# Patient Record
Sex: Female | Born: 1990 | Race: White | Hispanic: No | Marital: Single | Smoking: Former smoker
Health system: Southern US, Community
[De-identification: ages and names within clinical notes are randomized; demographics above are authoritative.]

## PROBLEM LIST (undated history)

## (undated) ENCOUNTER — Emergency Department (HOSPITAL_COMMUNITY): Admission: EM | Payer: Self-pay

## (undated) VITALS — BP 98/69 | HR 73 | Temp 98.3°F | Resp 19 | Ht 62.5 in | Wt 123.0 lb

## (undated) DIAGNOSIS — F32A Depression, unspecified: Secondary | ICD-10-CM

## (undated) DIAGNOSIS — F319 Bipolar disorder, unspecified: Secondary | ICD-10-CM

## (undated) DIAGNOSIS — F329 Major depressive disorder, single episode, unspecified: Secondary | ICD-10-CM

## (undated) DIAGNOSIS — F419 Anxiety disorder, unspecified: Secondary | ICD-10-CM

## (undated) DIAGNOSIS — T7840XA Allergy, unspecified, initial encounter: Secondary | ICD-10-CM

## (undated) HISTORY — DX: Anxiety disorder, unspecified: F41.9

## (undated) HISTORY — PX: TONSILLECTOMY: SUR1361

## (undated) HISTORY — DX: Bipolar disorder, unspecified: F31.9

## (undated) HISTORY — PX: OTHER SURGICAL HISTORY: SHX169

## (undated) HISTORY — DX: Allergy, unspecified, initial encounter: T78.40XA

---
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
## 2008-02-22 ENCOUNTER — Other Ambulatory Visit: Admission: RE | Admit: 2008-02-22 | Discharge: 2008-02-22 | Payer: Self-pay | Admitting: Obstetrics and Gynecology

## 2008-02-22 ENCOUNTER — Ambulatory Visit: Payer: Self-pay | Admitting: Obstetrics and Gynecology

## 2008-10-31 ENCOUNTER — Ambulatory Visit: Payer: Self-pay | Admitting: Obstetrics and Gynecology

## 2009-01-29 ENCOUNTER — Other Ambulatory Visit: Admission: RE | Admit: 2009-01-29 | Discharge: 2009-01-29 | Payer: Self-pay | Admitting: Obstetrics and Gynecology

## 2009-01-29 ENCOUNTER — Encounter: Payer: Self-pay | Admitting: Obstetrics and Gynecology

## 2009-01-29 ENCOUNTER — Ambulatory Visit: Payer: Self-pay | Admitting: Obstetrics and Gynecology

## 2009-07-04 ENCOUNTER — Ambulatory Visit: Payer: Self-pay | Admitting: Obstetrics and Gynecology

## 2009-07-11 ENCOUNTER — Ambulatory Visit: Payer: Self-pay | Admitting: Obstetrics and Gynecology

## 2010-01-18 ENCOUNTER — Emergency Department (HOSPITAL_COMMUNITY): Admission: EM | Admit: 2010-01-18 | Discharge: 2010-01-18 | Payer: Self-pay | Admitting: Emergency Medicine

## 2010-02-26 ENCOUNTER — Ambulatory Visit: Payer: Self-pay | Admitting: Obstetrics and Gynecology

## 2010-03-04 ENCOUNTER — Other Ambulatory Visit: Admission: RE | Admit: 2010-03-04 | Discharge: 2010-03-04 | Payer: Self-pay | Admitting: Obstetrics and Gynecology

## 2010-03-04 ENCOUNTER — Ambulatory Visit: Payer: Self-pay | Admitting: Obstetrics and Gynecology

## 2010-04-04 ENCOUNTER — Encounter (INDEPENDENT_AMBULATORY_CARE_PROVIDER_SITE_OTHER): Payer: Self-pay | Admitting: *Deleted

## 2010-07-17 NOTE — Letter (Signed)
Summary: New Patient letter  [facility]  [address] [place], [place] [address]   Phone: [phone]  Fax: [phone]       04/04/2010 MRN: [id number]  Dawn Porter [address] [place] [address]  Dear [name],  Welcome to the Gastroenterology Division at [facility].    You are scheduled to see Dr. Christella Hartigan on 05/14/2010 at 9:00AM on the 3rd floor at [facility], [address]  We ask that you try to arrive at our office 15 minutes prior to your appointment time to allow for check-in.  We would like you to complete the enclosed self-administered evaluation form prior to your visit and bring it with you on the day of your appointment.  We will review it with you.  Also, please bring a complete list of all your medications or, if you prefer, bring the medication bottles and we will list them.  Please bring your insurance card so that we may make a copy of it.  If your insurance requires a referral to see a specialist, please bring your referral form from your primary care physician.  Co-payments are due at the time of your visit and may be paid by cash, check or credit card.     Your office visit will consist of a consult with your physician (includes a physical exam), any laboratory testing he/she may order, scheduling of any necessary diagnostic testing (e.g. x-ray, ultrasound, CT-scan), and scheduling of a procedure (e.g. Endoscopy, Colonoscopy) if required.  Please allow enough time on your schedule to allow for any/all of these possibilities.    If you cannot keep your appointment, please call [phone] to cancel or reschedule prior to your appointment date.  This allows [place] the opportunity to schedule an appointment for another patient in need of care.  If you do not cancel or reschedule by 5 p.m. the business day prior to your appointment date, you will be charged a $50.00 late cancellation/no-show fee.    Thank you for choosing  [facility] for your medical needs.  We appreciate the opportunity to care for you.  Please visit [place] at our website  to learn more about our practice.                     Sincerely,                                                             The Gastroenterology Division

## 2010-08-13 ENCOUNTER — Other Ambulatory Visit: Payer: Self-pay | Admitting: Emergency Medicine

## 2010-08-13 ENCOUNTER — Ambulatory Visit
Admission: RE | Admit: 2010-08-13 | Discharge: 2010-08-13 | Disposition: A | Discharge status: Home / Self Care | Payer: PRIVATE HEALTH INSURANCE | Source: Ambulatory Visit | Attending: Emergency Medicine | Admitting: Emergency Medicine

## 2010-08-13 DIAGNOSIS — R110 Nausea: Secondary | ICD-10-CM

## 2010-08-13 DIAGNOSIS — S0990XA Unspecified injury of head, initial encounter: Secondary | ICD-10-CM

## 2010-10-22 ENCOUNTER — Ambulatory Visit: Payer: PRIVATE HEALTH INSURANCE | Admitting: Gastroenterology

## 2011-02-11 ENCOUNTER — Other Ambulatory Visit: Payer: Self-pay

## 2011-02-11 NOTE — Telephone Encounter (Signed)
Please give the patient Diflucan 150 mg daily for 3 days.

## 2011-02-11 NOTE — Telephone Encounter (Signed)
PT. HAS AN AEX WITH YOU 03-11-11. BUT CURRENTLY HAVING VAGINAL ITCHING AND WHITE DISCHARGE. REQUESTING REFILL DIFLUCAN. THINKS SHE IS ALLEIGIC  TO OTC YEAST RX'S.

## 2011-02-12 MED ORDER — FLUCONAZOLE 150 MG PO TABS: 150.0000 mg | ORAL_TABLET | Freq: Every day | ORAL | Status: AC

## 2011-02-12 NOTE — Telephone Encounter (Signed)
PT. NOTIFIED BY CELL # VOICEMAIL RX SENT TO [place].

## 2011-03-11 ENCOUNTER — Ambulatory Visit (INDEPENDENT_AMBULATORY_CARE_PROVIDER_SITE_OTHER): Payer: PRIVATE HEALTH INSURANCE | Admitting: Obstetrics and Gynecology

## 2011-03-11 ENCOUNTER — Other Ambulatory Visit (HOSPITAL_COMMUNITY)
Admission: RE | Admit: 2011-03-11 | Discharge: 2011-03-11 | Disposition: A | Discharge status: Home / Self Care | Payer: PRIVATE HEALTH INSURANCE | Source: Ambulatory Visit | Attending: Obstetrics and Gynecology | Admitting: Obstetrics and Gynecology

## 2011-03-11 ENCOUNTER — Encounter: Payer: Self-pay | Admitting: Obstetrics and Gynecology

## 2011-03-11 DIAGNOSIS — Z113 Encounter for screening for infections with a predominantly sexual mode of transmission: Secondary | ICD-10-CM

## 2011-03-11 DIAGNOSIS — R82998 Other abnormal findings in urine: Secondary | ICD-10-CM

## 2011-03-11 DIAGNOSIS — F319 Bipolar disorder, unspecified: Secondary | ICD-10-CM | POA: Insufficient documentation

## 2011-03-11 DIAGNOSIS — Z01419 Encounter for gynecological examination (general) (routine) without abnormal findings: Secondary | ICD-10-CM

## 2011-03-11 DIAGNOSIS — J45909 Unspecified asthma, uncomplicated: Secondary | ICD-10-CM | POA: Insufficient documentation

## 2011-03-11 NOTE — Progress Notes (Signed)
Patient came to see me today for her annual GYN exam. Her periods are regular. She has no current needs for contraception. She is having no GYN problems.  Physical examination: HEENT within normal limits. Neck: Thyroid not large. No masses. Supraclavicular nodes: not enlarged. Breasts: Examined in both sitting midline position. No skin changes and no masses. Abdomen: Soft no guarding rebound or masses or hernia. Pelvic: External: Within normal limits. BUS: Within normal limits. Vaginal:within normal limits. Good estrogen effect. No evidence of cystocele rectocele or enterocele. Cervix: clean. Uterus: Normal size and shape. Adnexa: No masses. Rectovaginal exam: Confirmatory and negative. Extremities: Within normal limits.  Assessment: Normal GYN exam  Plan: RTO one year recall

## 2011-08-28 ENCOUNTER — Ambulatory Visit (INDEPENDENT_AMBULATORY_CARE_PROVIDER_SITE_OTHER): Payer: PRIVATE HEALTH INSURANCE | Admitting: Obstetrics and Gynecology

## 2011-08-28 ENCOUNTER — Other Ambulatory Visit: Payer: Self-pay | Admitting: Obstetrics and Gynecology

## 2011-08-28 DIAGNOSIS — N899 Noninflammatory disorder of vagina, unspecified: Secondary | ICD-10-CM

## 2011-08-28 DIAGNOSIS — M549 Dorsalgia, unspecified: Secondary | ICD-10-CM

## 2011-08-28 DIAGNOSIS — N898 Other specified noninflammatory disorders of vagina: Secondary | ICD-10-CM

## 2011-08-28 LAB — URINALYSIS W MICROSCOPIC + REFLEX CULTURE
Bilirubin Urine: NEGATIVE
Glucose, UA: NEGATIVE mg/dL
Ketones, ur: NEGATIVE mg/dL
Leukocytes, UA: NEGATIVE
Specific Gravity, Urine: 1.015 (ref 1.005–1.030)
Urobilinogen, UA: 0.2 mg/dL (ref 0.0–1.0)

## 2011-08-28 LAB — WET PREP FOR TRICH, YEAST, CLUE
Clue Cells Wet Prep HPF POC: NONE SEEN
Trich, Wet Prep: NONE SEEN
Yeast Wet Prep HPF POC: NONE SEEN

## 2011-08-28 MED ORDER — TERCONAZOLE 0.8 % VA CREA: 1.0000 | TOPICAL_CREAM | Freq: Every day | VAGINAL | Status: AC

## 2011-08-28 NOTE — Progress Notes (Signed)
Patient came to see me today with a 4 day history of vaginal discharge, itching, and seeing some light spotting with the  Discharge. She called and the on call physician gave her 2 Diflucan which have not helped. Her last menstrual period was 3 weeks ago. She has noticed some left lower quadrant discomfort and back discomfort. She is having no dysuria, urgency, or frequency.  Exam: Kennon Portela present. Abdomen is soft without guarding rebound or masses. Pelvic exam: External: Within normal limits. BUS within normal limits. Vaginal exam: Within normal limits except for some white discharge. Cervix: Clean. Light spotting from os.  Uterus: Within normal limits. Adnexa: Within normal limits. Rectovaginal exam: Within normal limits. There is no tenderness on exam. Wet prep was negative except for red blood cells. Urinalysis also showed red blood cells and bacteria. Culture will be done.  Assessment: Probable yeast vaginitis not sense to Diflucan  Plan: terconazole 3 cream. Urine culture. Patient is started a new relationship and was interested in birth control. She would rather not use NuvaRing or pills. We discussed a Mirena IUD. Patient very interested. Booklet given. We will get her approved. She will call if pain persists.

## 2011-08-30 LAB — URINE CULTURE: Organism ID, Bacteria: NO GROWTH

## 2011-08-31 ENCOUNTER — Telehealth: Payer: Self-pay | Admitting: *Deleted

## 2011-08-31 ENCOUNTER — Other Ambulatory Visit: Payer: Self-pay | Admitting: *Deleted

## 2011-08-31 DIAGNOSIS — Z3049 Encounter for surveillance of other contraceptives: Secondary | ICD-10-CM

## 2011-08-31 MED ORDER — LEVONORGESTREL 20 MCG/24HR IU IUD: INTRAUTERINE_SYSTEM | Freq: Once | INTRAUTERINE | Status: DC

## 2011-08-31 NOTE — Telephone Encounter (Signed)
Message copied by Libby Maw on Mon Aug 31, 2011  3:40 PM ------      Message from: Trellis Paganini      Created: Fri Aug 28, 2011  2:40 PM       Please get patient approved for Mirena IUD.

## 2011-08-31 NOTE — Telephone Encounter (Signed)
Patient informed Mirena IUD covered at 100%.  Will call back to schedule insert with DG when on period.

## 2011-09-23 ENCOUNTER — Ambulatory Visit (INDEPENDENT_AMBULATORY_CARE_PROVIDER_SITE_OTHER): Payer: PRIVATE HEALTH INSURANCE | Admitting: Obstetrics and Gynecology

## 2011-09-23 DIAGNOSIS — Z3049 Encounter for surveillance of other contraceptives: Secondary | ICD-10-CM

## 2011-09-23 DIAGNOSIS — Z30430 Encounter for insertion of intrauterine contraceptive device: Secondary | ICD-10-CM

## 2011-09-23 NOTE — Progress Notes (Signed)
Patient came to see me today for Mirena IUD insertion due to need for contraception and dysmenorrhea. She previously tried  the NuvaRing but could not keep it in. She was not a good pill taker in terms of her remembering. She filled up the booklet and all her questions were answered.  Exam:Pelvic exam: External within normal limits. BUS within normal limits. Vaginal exam within normal limits. Cervix is clean without lesions. Uterus is normal size and shape. Adnexa failed to reveal masses. Rectovaginal examination is confirmatory and without masses. Kennon Portela present for exam and IUD insertion.  Assessment: Need for contraception and dysmenorrhea.  Plan: Patient was given a paracervical block with 20 cc of 1% plain Xylocaine. Patient was very nervous. Her uterus could not be sounded due to to cervical size. She was dilated. Her cervix then sounded to 6 cm. She has some discomfort with the procedure. A Mirena IUD was then inserted. Initially it was in place. However after several minutes she expelled it. We attempted insertion a second time and the same thing happened. We discussed other options including Depo-Provera or nexplanon. 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 We elected to try birth control pills and she will make an effort to find away can remember them. Samples of lo- Loestrin were given with explicit directions including condom backup for at least one cycle. Patient will inform.

## 2011-09-24 ENCOUNTER — Telehealth (HOSPITAL_COMMUNITY): Payer: Self-pay | Admitting: *Deleted

## 2011-09-24 ENCOUNTER — Encounter (HOSPITAL_COMMUNITY): Payer: Self-pay | Admitting: *Deleted

## 2011-09-24 ENCOUNTER — Inpatient Hospital Stay (HOSPITAL_COMMUNITY)
Admission: RE | Admit: 2011-09-24 | Discharge: 2011-09-25 | DRG: 882 | Disposition: A | Discharge status: Home / Self Care | Payer: PRIVATE HEALTH INSURANCE | Attending: Psychiatry | Admitting: Psychiatry

## 2011-09-24 DIAGNOSIS — F4310 Post-traumatic stress disorder, unspecified: Principal | ICD-10-CM | POA: Diagnosis present

## 2011-09-24 DIAGNOSIS — R45851 Suicidal ideations: Secondary | ICD-10-CM

## 2011-09-24 DIAGNOSIS — F319 Bipolar disorder, unspecified: Secondary | ICD-10-CM

## 2011-09-24 DIAGNOSIS — J45909 Unspecified asthma, uncomplicated: Secondary | ICD-10-CM | POA: Diagnosis present

## 2011-09-24 HISTORY — DX: Depression, unspecified: F32.A

## 2011-09-24 HISTORY — DX: Major depressive disorder, single episode, unspecified: F32.9

## 2011-09-24 MED ORDER — MAGNESIUM HYDROXIDE 400 MG/5ML PO SUSP: 30.0000 mL | Freq: Every day | ORAL | Status: DC | PRN

## 2011-09-24 MED ORDER — TRAZODONE HCL 50 MG PO TABS
50.0000 mg | ORAL_TABLET | Freq: Every evening | ORAL | Status: DC | PRN
  Administered 2011-09-24: 50 mg via ORAL
  Filled 2011-09-24: qty 1

## 2011-09-24 MED ORDER — LAMOTRIGINE 25 MG PO TABS
25.0000 mg | ORAL_TABLET | Freq: Two times a day (BID) | ORAL | Status: DC
  Administered 2011-09-24: 25 mg via ORAL
  Filled 2011-09-24 (×7): qty 1

## 2011-09-24 MED ORDER — ACETAMINOPHEN 325 MG PO TABS: 650.0000 mg | ORAL_TABLET | Freq: Four times a day (QID) | ORAL | Status: DC | PRN

## 2011-09-24 MED ORDER — ALUM & MAG HYDROXIDE-SIMETH 200-200-20 MG/5ML PO SUSP
30.0000 mL | ORAL | Status: DC | PRN
Start: 2011-09-24 — End: 2011-09-25

## 2011-09-24 MED ORDER — LORATADINE 10 MG PO TABS
10.0000 mg | ORAL_TABLET | Freq: Every day | ORAL | Status: DC
  Administered 2011-09-24 – 2011-09-25 (×2): 10 mg via ORAL
  Filled 2011-09-24 (×5): qty 1

## 2011-09-24 NOTE — Progress Notes (Signed)
Patient ID: Dawn Porter, female   DOB: 07/03/1990, 21 y.o.   MRN: [id number] Pt. Reports stressors with school, reports depression at "9" out of 10, denies SHI. Pt. 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 Clinical research associate reviewed meds, pt. Notes that she take Lamictal 50 mg at home. Writer informed pt. That meds will she will be seeing the physician in am for adjustments to meds. Staff will monitor q15 min for safety. Pt. Is safe on the unit.

## 2011-09-24 NOTE — Progress Notes (Addendum)
Patient ID: Dawn Porter, female   DOB: 1990-07-24, 21 y.o.   MRN: [id number] Pt is voluntary. Pt is here because since yesterday when she went to get a IUD in and it did not work it caused her a lot of pain stressed her out. Pt woke up in the morning just feeling like she wanted to die. Pt has been depressed for her whole life and feels it also due to her being bipolar. Pt has also had a recent break up and had someone close to her die in the past year. Pt rates depression a an 8, anxiety at a 7, rates hopelessness/helplessness at a 6. Pt also suffers from OCD. Pt is currently a Consulting civil engineer at [employer]. Pt still is having suicidal ideations but does contract for safety. Pt is cooperative and polite.

## 2011-09-24 NOTE — Tx Team (Signed)
Initial Interdisciplinary Treatment Plan  PATIENT STRENGTHS: (choose at least two) Ability for insight Communication skills General fund of knowledge Motivation for treatment/growth Supportive family/friends  PATIENT STRESSORS: School and realtionship    PROBLEM LIST: Problem List/Patient Goals Date to be addressed Date deferred Reason deferred Estimated date of resolution  Deppression 09/24/11     Anxiety 09/24/11                                                DISCHARGE CRITERIA:  Adequate post-discharge living arrangements Improved stabilization in mood, thinking, and/or behavior Motivation to continue treatment in a less acute level of care  PRELIMINARY DISCHARGE PLAN: Attend aftercare/continuing care group Outpatient therapy Return to previous living arrangement  PATIENT/FAMIILY INVOLVEMENT: This treatment plan has been presented to and reviewed with the patient, [name], and/or family member.  The patient and family have been given the opportunity to ask questions and make suggestions.  [name] 09/24/2011, 7:23 PM
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
## 2011-09-25 DIAGNOSIS — R45851 Suicidal ideations: Secondary | ICD-10-CM

## 2011-09-25 DIAGNOSIS — F4310 Post-traumatic stress disorder, unspecified: Secondary | ICD-10-CM | POA: Diagnosis present

## 2011-09-25 MED ORDER — LORAZEPAM 0.5 MG PO TABS: 0.5000 mg | ORAL_TABLET | Freq: Every day | ORAL | Status: DC | PRN

## 2011-09-25 MED ORDER — MOMETASONE FUROATE 50 MCG/ACT NA SUSP: 2.0000 | Freq: Every day | NASAL | Status: DC

## 2011-09-25 MED ORDER — CETIRIZINE HCL 10 MG PO CHEW: 10.0000 mg | CHEWABLE_TABLET | Freq: Every day | ORAL | Status: DC

## 2011-09-25 MED ORDER — TEARS RENEWED OP SOLN: 1.0000 [drp] | Freq: Every day | OPHTHALMIC | Status: DC | PRN

## 2011-09-25 MED ORDER — MULTI-VITAMIN/MINERALS PO TABS: 1.0000 | ORAL_TABLET | Freq: Every day | ORAL | Status: DC

## 2011-09-25 MED ORDER — PRAZOSIN HCL 1 MG PO CAPS: 1.0000 mg | ORAL_CAPSULE | Freq: Every evening | ORAL | Status: DC | PRN

## 2011-09-25 MED ORDER — DEXMETHYLPHENIDATE HCL 5 MG PO TABS: 5.0000 mg | ORAL_TABLET | Freq: Every day | ORAL | Status: DC | PRN

## 2011-09-25 MED ORDER — LAMOTRIGINE 25 MG PO TABS: 50.0000 mg | ORAL_TABLET | Freq: Every day | ORAL | Status: DC

## 2011-09-25 MED ORDER — ONE-DAILY MULTI VITAMINS PO TABS: 1.0000 | ORAL_TABLET | Freq: Every day | ORAL | Status: AC

## 2011-09-25 MED ORDER — BIOTIN 5000 MCG PO TABS: 1.0000 | ORAL_TABLET | Freq: Every day | ORAL | Status: DC

## 2011-09-25 MED ORDER — LAMOTRIGINE 25 MG PO TABS: 25.0000 mg | ORAL_TABLET | Freq: Two times a day (BID) | ORAL | Status: DC

## 2011-09-25 NOTE — Progress Notes (Signed)
[facility] Adult Inpatient Family/Significant Other Suicide Prevention Education  Suicide Prevention Education:  Education Completed; Dawn Porter, mother, [phone]) has been identified by the patient as the family member/significant other with whom the patient will be residing, and identified as the person(s) who will aid the patient in the event of a mental health crisis (suicidal ideations/suicide attempt).  With written consent from the patient, the family member/significant other has been provided the following suicide prevention education, prior to the and/or following the discharge of the patient.  The suicide prevention education provided includes the following:  Suicide risk factors  Suicide prevention and interventions  National Suicide Hotline telephone number  [facility] assessment telephone number  [facility] Emergency Assistance 911  [facility] and/or Residential Mobile Crisis Unit telephone number  Request made of family/significant other to:  Remove weapons (e.g., guns, rifles, knives), all items previously/currently identified as safety concern.    Remove drugs/medications (over-the-counter, prescriptions, illicit drugs), all items previously/currently identified as a safety concern.  Dawn Porter reports that she has no concerns about Dawn Porter being a danger to herself or others. She verbalized understanding of suicide prevention information and verified that Dawn Porter has no access to firearms. She had no further questions.   Dawn Porter 09/25/2011, 2:15 PM

## 2011-09-25 NOTE — Progress Notes (Signed)
09/25/2011         Time: 1415      Group Topic/Focus: The focus of this group is on discussing various styles of communication and communicating assertively using 'I' (feeling) statements.  Participation Level: Active  Participation Quality: Appropriate and Attentive  Affect: Blunted  Cognitive: Oriented   Additional Comments: None.   Dawn Porter 09/25/2011 3:52 PM

## 2011-09-25 NOTE — Progress Notes (Signed)
Patient ID: Dawn Porter, female   DOB: May 16, 1991, 21 y.o.   MRN: [id number] Pt denies HI/AVH, passive SI on & off--contracts for safety.  Dawn Porter refused her antidepressant this morning, wants the total dose this evening--will discuss with MD.  Sh reported on her self inventory that she slept well, appetite good, energy level normal, ability to pay attention good, 4/10 depression, 2/10 hopelessness.
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
## 2011-09-25 NOTE — Tx Team (Addendum)
Interdisciplinary Treatment Plan Update (Adult)  Date:  09/25/2011  Time Reviewed:  10:08 AM   Progress in Treatment: Attending groups: Yes Participating in groups:  Yes Taking medication as prescribed:  Yes Tolerating medication: Yes Family/Significant othe contact made:  Requesting consent to contact parents Patient understands diagnosis: Yes Discussing patient identified problems/goals with staff:  Yes Medical problems stabilized or resolved: Yes Denies suicidal/homicidal ideation: Yes Issues/concerns per patient self-inventory:  No  Other:  New problem(s) identified: None  Reason for Continuation of Hospitalization: Medication stabilization  Interventions implemented related to continuation of hospitalization:  Medication stabilization, safety checks q 15 mins, group attendance  Additional comments:  Estimated length of stay:  3-5 days  Discharge Plan:  Discharge home with parents, follow up with  Neuropsychiatry and CM will set appointment with therapist in [facility] goal(s):  Review of initial/current patient goals per problem list:   1.  Goal(s): Decrease depressive symptoms to rating of 4 or less  Met:  Yes  Target date: by discharge  As evidenced by:  Dawn Porter rates depression at 3 today  2.  Goal (s): Decrease anxiety symptoms to rating of 4 or less   Met:  Yes  Target date: by discharge  As evidenced by: Dawn Porter rates anxiety at 3 today  3.  Goal(s): Reduce potential for self-harm  Met:  Yes  Target date: by discharge  As evidenced by: Dawn Porter denies any thoughts of suicide today  4.  Goal(s): Medication stabilization  Met:  No  Target date: by discharge  As evidenced by: Dawn Porter will report medications are working  to reduce/relieve symptoms without intolerable side effects  Attendees: Patient:  Dawn Porter 09/25/2011 10:08 AM  Family:     Physician:  Dr Orson Aloe, MD 09/25/2011 10:08 AM  Nursing:   Clearwater Cellar, RN  09/25/2011 10:08 AM  Case Manager:  Juline Patch, LCSW 09/25/2011 10:08 AM  Counselor:  Angus Palms, LCSW 09/25/2011 10:08 AM  Other:  Reyes Ivan, LCSWA 09/25/2011 10:08 AM  Other:     Other:     Other:      Scribe for Treatment Team:   [name], 09/25/2011 10:08 AM

## 2011-09-25 NOTE — Progress Notes (Signed)
Patient ID: Dawn Porter, female   DOB: 06-10-1991, 21 y.o.   MRN: [id number] Pt. met with treatment team and was allowed to be discharged today, without having completed admission labs, etc.; Pt. was given discharge instructions with handouts, prescriptions and belongings were returned.  Pt. stated she understands her discharge instructions and releases of information were signed and Pt. was discharged ambulatory to her awaiting ride home.

## 2011-09-25 NOTE — H&P (Signed)
Medical/psychiatric screening examination/treatment/procedure(s) were performed by non-physician practitioner and as supervising physician I was immediately available for consultation/collaboration.   I have seen and examined this patient and agree with this evaluation.  

## 2011-09-25 NOTE — Progress Notes (Signed)
[facility] Adult Inpatient Family/Significant Other Suicide Prevention Education  Suicide Prevention Education:  Contact Attempts: Luster Landsberg and Cheyane Ayon, parents [phone]) have been identified by the patient as the family member/significant other with whom the patient will be residing, and identified as the person(s) who will aid the patient in the event of a mental health crisis.  With written consent from the patient, two attempts were made to provide suicide prevention education, prior to and/or following the patient's discharge.  We were unsuccessful in providing suicide prevention education.  A suicide education pamphlet was given to the patient to share with family/significant other.  Date and time of first attempt: 09/25/11  @  1:58pm Date and time of second attempt:  [name] 09/25/2011, 1:59 PM

## 2011-09-25 NOTE — Progress Notes (Signed)
[facility] Group Notes:  (Counselor/Nursing/MHT/Case Management/Adjunct) 09/25/2011 11:00am Preventing Relapse   Type of Therapy:  Group Therapy  Participation Level:  Did Not Attend     [name] 09/25/2011  2:57 PM
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
## 2011-09-25 NOTE — [facility] Suicide Risk Assessment (Signed)
Suicide Risk Assessment  Admission Assessment     See Discharge Risk Assessment  Dawn Porter 09/25/2011, 4:05 PM

## 2011-09-28 NOTE — Discharge Summary (Signed)
I agree with this D/C Summary.  

## 2011-09-29 ENCOUNTER — Telehealth: Payer: Self-pay | Admitting: *Deleted

## 2011-09-29 MED ORDER — FLUCONAZOLE 150 MG PO TABS: ORAL_TABLET | ORAL | Status: DC

## 2011-09-29 NOTE — Telephone Encounter (Signed)
rx sent to pharmacy

## 2011-09-29 NOTE — Progress Notes (Signed)
Patient Discharge Instructions:  Psychiatric Admission Assessment Note Provided,  09/29/2011 After Visit Summary (AVS) Provided,  09/29/2011 Face Sheet Provided, 09/29/2011 Faxed/Sent to the Next Level Care provider:  09/29/2011 Provided Suicide Risk Assessment - Discharge Assessment 09/29/2011  Faxed to [facility] [place] @ [phone]  Wandra Scot, 09/29/2011, 5:53 PM

## 2011-09-29 NOTE — Telephone Encounter (Signed)
Pt calling c/o yeast infection x 2 days itching, white discharge only. Pt would like rx sent to pharmacy to help relieve 09/23/11.

## 2011-09-29 NOTE — Telephone Encounter (Signed)
Diflucan 150mg daily for 3 days.

## 2011-09-29 NOTE — Telephone Encounter (Signed)
Addended by: Aura Camps on: 09/29/2011 11:34 AM   Modules accepted: Orders

## 2011-11-11 ENCOUNTER — Ambulatory Visit (INDEPENDENT_AMBULATORY_CARE_PROVIDER_SITE_OTHER): Payer: PRIVATE HEALTH INSURANCE | Admitting: Obstetrics and Gynecology

## 2011-11-11 DIAGNOSIS — N939 Abnormal uterine and vaginal bleeding, unspecified: Secondary | ICD-10-CM

## 2011-11-11 DIAGNOSIS — N949 Unspecified condition associated with female genital organs and menstrual cycle: Secondary | ICD-10-CM

## 2011-11-11 DIAGNOSIS — Z113 Encounter for screening for infections with a predominantly sexual mode of transmission: Secondary | ICD-10-CM

## 2011-11-11 DIAGNOSIS — N938 Other specified abnormal uterine and vaginal bleeding: Secondary | ICD-10-CM

## 2011-11-11 DIAGNOSIS — R102 Pelvic and perineal pain: Secondary | ICD-10-CM

## 2011-11-11 DIAGNOSIS — N926 Irregular menstruation, unspecified: Secondary | ICD-10-CM

## 2011-11-11 MED ORDER — MEGESTROL ACETATE 40 MG PO TABS: 40.0000 mg | ORAL_TABLET | Freq: Two times a day (BID) | ORAL | Status: AC

## 2011-11-11 NOTE — Progress Notes (Signed)
Patient came to see me today for  a problem visit. She is on her second month of birth control pills. The first month she did well with no pain or bleeding. She had very light withdrawal. The second month she did well until 4 days ago when she had sharp pain in her lower abdomen and heavy breakthrough bleeding. She is on the third week of her pills. She tried to have intercourse 2 weeks ago and stopped because it hurt. She is having no nausea , vomiting or fever. She is on Lamictal and another drug for bipolar disease and wanted to be sure this would not make her pill less than effective.  Exam: Kennon Portela present. Abdomen is soft without guarding rebound or masses.Pelvic exam: External: Within normal limits. BUS within normal limits. Vaginal exam: Within normal limits. Cervix: Clean. Uterus: Within normal limits but tender. Adnexa: Within normal limits. Rectovaginal exam: Within normal limits.   Assessment: DUB with pelvic pain  Plan: Patient to Megace 40 mg twice a day to birth control pills until she finishes the cycle. She will do her birth control pills alone next cycle. Long acting naproxen given to patient. She will check with pharmacist to be sure that her bipolar drugs do not interfere with effectiveness of her birth control pills. GC and chlamydial cultures done. We will ultrasound her if  pain persists.

## 2011-12-16 ENCOUNTER — Ambulatory Visit (INDEPENDENT_AMBULATORY_CARE_PROVIDER_SITE_OTHER): Payer: PRIVATE HEALTH INSURANCE | Admitting: Obstetrics and Gynecology

## 2011-12-16 DIAGNOSIS — N949 Unspecified condition associated with female genital organs and menstrual cycle: Secondary | ICD-10-CM

## 2011-12-16 DIAGNOSIS — R300 Dysuria: Secondary | ICD-10-CM

## 2011-12-16 DIAGNOSIS — N938 Other specified abnormal uterine and vaginal bleeding: Secondary | ICD-10-CM

## 2011-12-16 LAB — URINALYSIS W MICROSCOPIC + REFLEX CULTURE
Bilirubin Urine: NEGATIVE
Glucose, UA: NEGATIVE mg/dL
Ketones, ur: NEGATIVE mg/dL
Protein, ur: NEGATIVE mg/dL

## 2011-12-16 MED ORDER — NITROFURANTOIN MACROCRYSTAL 50 MG PO CAPS: ORAL_CAPSULE | ORAL | Status: DC

## 2011-12-16 MED ORDER — NITROFURANTOIN MONOHYD MACRO 100 MG PO CAPS: 100.0000 mg | ORAL_CAPSULE | Freq: Two times a day (BID) | ORAL | Status: AC
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
## 2011-12-18 LAB — URINE CULTURE: Colony Count: 4000

## 2011-12-23 ENCOUNTER — Ambulatory Visit: Payer: Self-pay | Admitting: Obstetrics and Gynecology

## 2011-12-23 ENCOUNTER — Other Ambulatory Visit: Payer: Self-pay

## 2011-12-24 ENCOUNTER — Ambulatory Visit (INDEPENDENT_AMBULATORY_CARE_PROVIDER_SITE_OTHER): Payer: PRIVATE HEALTH INSURANCE | Admitting: Obstetrics and Gynecology

## 2011-12-24 ENCOUNTER — Other Ambulatory Visit: Payer: Self-pay | Admitting: Obstetrics and Gynecology

## 2011-12-24 DIAGNOSIS — R300 Dysuria: Secondary | ICD-10-CM

## 2011-12-24 DIAGNOSIS — N898 Other specified noninflammatory disorders of vagina: Secondary | ICD-10-CM

## 2011-12-24 DIAGNOSIS — L293 Anogenital pruritus, unspecified: Secondary | ICD-10-CM

## 2011-12-24 LAB — URINALYSIS W MICROSCOPIC + REFLEX CULTURE
Casts: NONE SEEN
Ketones, ur: NEGATIVE mg/dL
Nitrite: NEGATIVE
Urobilinogen, UA: 0.2 mg/dL (ref 0.0–1.0)

## 2011-12-24 LAB — WET PREP FOR TRICH, YEAST, CLUE
Clue Cells Wet Prep HPF POC: NONE SEEN
Yeast Wet Prep HPF POC: NONE SEEN

## 2011-12-24 MED ORDER — TERCONAZOLE 0.8 % VA CREA: 1.0000 | TOPICAL_CREAM | Freq: Every day | VAGINAL | Status: AC

## 2011-12-24 MED ORDER — NORETHIN-ETH ESTRAD-FE BIPHAS 1 MG-10 MCG / 10 MCG PO TABS: 1.0000 | ORAL_TABLET | Freq: Every day | ORAL | Status: DC

## 2011-12-24 MED ORDER — FLUCONAZOLE 200 MG PO TABS: 200.0000 mg | ORAL_TABLET | Freq: Every day | ORAL | Status: AC

## 2011-12-24 NOTE — Progress Notes (Signed)
Patient came to see me today complaining of vulvar irritation. She is previously been on antibiotics by me for urinary tract infection which resolved her symptoms. She has been doing a lot of horseback  riding recently wearing  tight clothes and she thinks this has caused the above. She is now on her third pack of her birth control pills and is doing well on them with still some midcycle spotting.  Exam: Dawn Porter present. External: 3+ vulvitis. BUS: Within normal limits. Vaginal exam: Slight discharge with negative wet prep. Urinalysis: Many red blood cells present but patient is having vaginal bleeding.  Assessment: Yeast vaginitis in spite of negative wet prep. Urinary tract infection probably resolved.  Plan: Terconazole 3 cream-use externally and internally. Diflucan 200 mg daily for 3 days if needed after terconazole. Urine culture to be sure UTI is  Gone. Low Loestrin refilled. Yearly visit in October.

## 2012-06-01 ENCOUNTER — Ambulatory Visit (INDEPENDENT_AMBULATORY_CARE_PROVIDER_SITE_OTHER): Payer: PRIVATE HEALTH INSURANCE | Admitting: Women's Health

## 2012-06-01 ENCOUNTER — Encounter: Payer: Self-pay | Admitting: Women's Health

## 2012-06-01 VITALS — BP 112/76 | Ht 63.0 in | Wt 126.0 lb

## 2012-06-01 DIAGNOSIS — Z309 Encounter for contraceptive management, unspecified: Secondary | ICD-10-CM

## 2012-06-01 DIAGNOSIS — Z01419 Encounter for gynecological examination (general) (routine) without abnormal findings: Secondary | ICD-10-CM

## 2012-06-01 DIAGNOSIS — IMO0001 Reserved for inherently not codable concepts without codable children: Secondary | ICD-10-CM

## 2012-06-01 DIAGNOSIS — F4310 Post-traumatic stress disorder, unspecified: Secondary | ICD-10-CM

## 2012-06-01 DIAGNOSIS — Z113 Encounter for screening for infections with a predominantly sexual mode of transmission: Secondary | ICD-10-CM

## 2012-06-01 LAB — CBC WITH DIFFERENTIAL/PLATELET
Eosinophils Absolute: 0.4 10*3/uL (ref 0.0–0.7)
Eosinophils Relative: 6 % — ABNORMAL HIGH (ref 0–5)
HCT: 41.4 % (ref 36.0–46.0)
Lymphs Abs: 2.2 10*3/uL (ref 0.7–4.0)
MCH: 29.1 pg (ref 26.0–34.0)
MCV: 85.5 fL (ref 78.0–100.0)
Monocytes Absolute: 0.5 10*3/uL (ref 0.1–1.0)
Monocytes Relative: 8 % (ref 3–12)
Platelets: 347 10*3/uL (ref 150–400)
RBC: 4.84 MIL/uL (ref 3.87–5.11)

## 2012-06-01 LAB — HEPATITIS C ANTIBODY: HCV Ab: NEGATIVE

## 2012-06-01 MED ORDER — NORETHIN-ETH ESTRAD-FE BIPHAS 1 MG-10 MCG / 10 MCG PO TABS: 1.0000 | ORAL_TABLET | Freq: Every day | ORAL | Status: DC
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
## 2012-06-01 NOTE — Patient Instructions (Addendum)

## 2012-06-02 LAB — URINALYSIS W MICROSCOPIC + REFLEX CULTURE
Bilirubin Urine: NEGATIVE
Casts: NONE SEEN
Crystals: NONE SEEN
Glucose, UA: NEGATIVE mg/dL
Hgb urine dipstick: NEGATIVE
Ketones, ur: NEGATIVE mg/dL
Specific Gravity, Urine: 1.015 (ref 1.005–1.030)
pH: 6 (ref 5.0–8.0)

## 2012-06-02 LAB — GC/CHLAMYDIA PROBE AMP: CT Probe RNA: NEGATIVE

## 2012-07-04 ENCOUNTER — Ambulatory Visit: Payer: PRIVATE HEALTH INSURANCE

## 2012-07-04 ENCOUNTER — Ambulatory Visit (INDEPENDENT_AMBULATORY_CARE_PROVIDER_SITE_OTHER): Payer: PRIVATE HEALTH INSURANCE | Admitting: Family Medicine

## 2012-07-04 VITALS — BP 106/71 | HR 71 | Temp 98.1°F | Resp 16 | Ht 64.0 in | Wt 126.0 lb

## 2012-07-04 DIAGNOSIS — W19XXXA Unspecified fall, initial encounter: Secondary | ICD-10-CM

## 2012-07-04 DIAGNOSIS — R071 Chest pain on breathing: Secondary | ICD-10-CM

## 2012-07-04 DIAGNOSIS — R0789 Other chest pain: Secondary | ICD-10-CM

## 2012-07-04 MED ORDER — MELOXICAM 7.5 MG PO TABS: 7.5000 mg | ORAL_TABLET | Freq: Every day | ORAL | Status: DC

## 2012-07-04 NOTE — Progress Notes (Signed)
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  She then ran after the horse and caught up with it a half mile down the road.  She now complains of right  Back, back of arms and rib cage.  Some discomfort with deep breath or laughing.  No significant abdominal pain. No vomiting  She does have a headache.  Abdomen is also sore with laughing, perhaps a bit worse today than yesterday  Objective:  Alert, good eye contact, NAD Chest:  Diffusely tender palpating the ribs.  Good BS bilaterally Heart: reg, no murmur Abdomen:  Tender upper abdomen, no ecchymosis, no HSM or mass Skin: no abrasions or ecchymosis Upper extrem:  FROM [facility] reading (PRIMARY) by  Dr. Milus Glazier.CXR: no sign of trauma  Assessment:  Fall from horse, chest pain .  At this point, patient is 2 days out from her fall. Not seeing any serious signs of injury and the fact that she could running after the horse for half a mile after her fall suggest that most of her injuries are soft tissue   Plan:

## 2012-07-04 NOTE — Patient Instructions (Addendum)
Chest Wall Pain Chest wall pain is pain in or around the bones and muscles of your chest. It may take up to 6 weeks to get better. It may take longer if you must stay physically active in your work and activities.  CAUSES  Chest wall pain may happen on its own. However, it may be caused by:  A viral illness like the flu.  Injury.  Coughing.  Exercise.  Arthritis.  Fibromyalgia.  Shingles. HOME CARE INSTRUCTIONS   Avoid overtiring physical activity. Try not to strain or perform activities that cause pain. This includes any activities using your chest or your abdominal and side muscles, especially if heavy weights are used.  Put ice on the sore area.  Put ice in a plastic bag.  Place a towel between your skin and the bag.  Leave the ice on for 15 to 20 minutes per hour while awake for the first 2 days.  Only take over-the-counter or prescription medicines for pain, discomfort, or fever as directed by your caregiver. SEEK IMMEDIATE MEDICAL CARE IF:   Your pain increases, or you are very uncomfortable.  You have a fever.  Your chest pain becomes worse.  You have new, unexplained symptoms.  You have nausea or vomiting.  You feel sweaty or lightheaded.  You have a cough with phlegm (sputum), or you cough up blood. MAKE SURE YOU:   Understand these instructions.  Will watch your condition.  Will get help right away if you are not doing well or get worse. Document Released: 06/01/2005 Document Revised: 08/24/2011 Document Reviewed: 01/26/2011 [employer] Patient Information 2013 [employer].  

## 2012-07-15 ENCOUNTER — Encounter (HOSPITAL_COMMUNITY): Payer: Self-pay | Admitting: *Deleted

## 2012-07-15 ENCOUNTER — Emergency Department (HOSPITAL_COMMUNITY): Payer: PRIVATE HEALTH INSURANCE

## 2012-07-15 ENCOUNTER — Emergency Department (HOSPITAL_COMMUNITY)
Admission: EM | Admit: 2012-07-15 | Discharge: 2012-07-16 | Disposition: A | Discharge status: Home / Self Care | Payer: PRIVATE HEALTH INSURANCE | Attending: Emergency Medicine | Admitting: Emergency Medicine

## 2012-07-15 DIAGNOSIS — Y9389 Activity, other specified: Secondary | ICD-10-CM | POA: Insufficient documentation

## 2012-07-15 DIAGNOSIS — S60229A Contusion of unspecified hand, initial encounter: Secondary | ICD-10-CM | POA: Insufficient documentation

## 2012-07-15 DIAGNOSIS — F411 Generalized anxiety disorder: Secondary | ICD-10-CM | POA: Insufficient documentation

## 2012-07-15 DIAGNOSIS — Z8659 Personal history of other mental and behavioral disorders: Secondary | ICD-10-CM | POA: Insufficient documentation

## 2012-07-15 DIAGNOSIS — Z87891 Personal history of nicotine dependence: Secondary | ICD-10-CM | POA: Insufficient documentation

## 2012-07-15 DIAGNOSIS — Z79899 Other long term (current) drug therapy: Secondary | ICD-10-CM | POA: Insufficient documentation

## 2012-07-15 DIAGNOSIS — Y9289 Other specified places as the place of occurrence of the external cause: Secondary | ICD-10-CM | POA: Insufficient documentation

## 2012-07-15 DIAGNOSIS — F3160 Bipolar disorder, current episode mixed, unspecified: Secondary | ICD-10-CM | POA: Insufficient documentation

## 2012-07-15 DIAGNOSIS — S60221A Contusion of right hand, initial encounter: Secondary | ICD-10-CM

## 2012-07-15 DIAGNOSIS — J45909 Unspecified asthma, uncomplicated: Secondary | ICD-10-CM | POA: Insufficient documentation

## 2012-07-15 DIAGNOSIS — W230XXA Caught, crushed, jammed, or pinched between moving objects, initial encounter: Secondary | ICD-10-CM | POA: Insufficient documentation

## 2012-07-15 NOTE — ED Notes (Signed)
Pt c/o R hand pain, swelling and bruising after sustaining a crush injury when letting a horse out of a stall. Pt has decreased ROM secondary to pain in R hand. Pt has bruising to posterior R hand.

## 2012-07-16 LAB — POCT PREGNANCY, URINE: Preg Test, Ur: NEGATIVE

## 2012-07-16 MED ORDER — HYDROCODONE-ACETAMINOPHEN 5-325 MG PO TABS
1.0000 | ORAL_TABLET | Freq: Once | ORAL | Status: AC
  Administered 2012-07-16: 1 via ORAL
  Filled 2012-07-16: qty 1

## 2012-07-16 MED ORDER — IBUPROFEN 800 MG PO TABS: 800.0000 mg | ORAL_TABLET | Freq: Three times a day (TID) | ORAL | Status: DC | PRN

## 2012-07-16 MED ORDER — IBUPROFEN 800 MG PO TABS
800.0000 mg | ORAL_TABLET | Freq: Once | ORAL | Status: AC
  Administered 2012-07-16: 800 mg via ORAL
  Filled 2012-07-16: qty 1

## 2012-07-16 NOTE — Discharge Instructions (Signed)
Contusion (Bruise) of Hand  An injury to the hand may cause bruises (contusions). Contusions are caused by bleeding from small blood vessels (capillaries) that allow blood to leak out into the muscles, tendons, and surrounding soft tissue. This is followed by swelling and pain (inflammation). Contusions of the hand are common because of the use of hands in daily and recreational activities. Signs of a hand injury include pain, swelling, and a color change. Initially the skin may turn blue to purple in color. As the bruise ages, the color turns yellow and orange. Swelling may decrease the movement of the fingers. Contusions are seen more commonly with:   Contact sports (especially in football, wrestling, and basketball).   Use of medications that thin the blood (anticoagulants).   Use of aspirin and nonsteroidal anti-inflammatory agents that decrease the ability of the blood to clot.   Vitamin deficiencies.   Aging.  DIAGNOSIS   Diagnosis of hand injuries can be made by your own observation. If problems continue, a caregiver may be required for further evaluation and treatment. X-rays may be required to make sure there are no broken bones (fractures). Continued problems may require physical therapy for treatment.  RISKS AND COMPLICATIONS   Extensive bleeding and tissue inflammation. This can lead to disability and arthritis-type problems later on if the hand does not heal properly.   Infection of the hand if there are breaks in the skin. This is especially true if the hand injury came from someone's teeth, such as would occur with punching someone in the mouth. This can lead to an infection of the tendons and the membranes surrounding the tendons (sheaths). This infection can have severe complications including a loss of function (a "frozen" hand).   Rupture of the tendons requiring a surgical repair. Failure to repair the tendons can result in loss of function of the hand or fingers.  HOME CARE INSTRUCTIONS     Apply ice to the injury for 15 to 20 minutes, 3 to 4 times per day. Put the ice in a plastic bag and place a towel between the bag of ice and your skin.   An elastic bandage may be used initially for support and to minimize swelling. Do not wrap the hand too tightly. Do not sleep with the elastic bandage on.   Gentle massage from the fingertips towards the elbow will help keep the swelling down. Gently open and close your fist while doing this to maintain range of motion. Do this only after the first few days, when there is no or minimal pain.   Keep your hand above the level of the heart when swelling and pain are present. This will allow the fluid to drain out of the hand, decreasing the amount of swelling. This will improve healing time.   Try to avoid use of the injured hand (except for gentle range of motion) while the hand is hurting. Do not resume use until instructed by your caregiver. Then begin use gradually, do not increase use to the point of pain. If pain does develop, decrease use and continue the above measures, gradually increasing activities that do not cause discomfort until you achieve normal use.   Only take over-the-counter or prescription medicines for pain, discomfort, or fever as directed by your caregiver.   Follow up with your caregiver as directed. Follow-up care may include orthopedic referrals, physical therapy, and rehabilitation. Any delay in obtaining necessary care could result in delayed healing, or temporary or permanent disability.  REHABILITATION     only have minimal pain.  Use ice massage for 10 minutes before and after workouts. Put ice in a plastic bag and place a towel between the bag of ice and your skin. Massage the injured area with the ice pack. SEEK IMMEDIATE MEDICAL CARE IF:   Your pain and swelling increase, or pain is uncontrolled with  medications.  You have loss of feeling in your hand, or your hand turns cold or blue.  An oral temperature above 102 F (38.9 C) develops, not controlled by medication.  Your hand becomes warm to the touch, or you have increased pain with even slight movement of your fingers.  Your hand does not begin to improve in 1 or 2 days.  The skin is broken and signs of infection occur (fluid draining from the contusion, increasing pain, fever, headache, muscle aches, dizziness, or a general ill feeling).  You develop new, unexplained problems, or an increase of the symptoms that brought you to your caregiver. MAKE SURE YOU:   Understand these instructions.  Will watch your condition.  Will get help right away if you are not doing well or get worse. Document Released: 11/21/2001 Document Revised: 08/24/2011 Document Reviewed: 11/08/2009 [facility] Patient Information 2013 [place], [place]. Wear the ACE bandage for comfort Remember RICE   Rest, ice, compression and elevation

## 2012-07-16 NOTE — ED Provider Notes (Signed)
History     CSN: [id number]  Arrival date & time 07/15/12  2229   First MD Initiated Contact with Patient 07/15/12 2357      Chief Complaint  Patient presents with  . Hand Injury    (Consider location/radiation/quality/duration/timing/severity/associated sxs/prior treatment) HPI Comments: Patient was working with horses, one tried to get out of the stall and patient had her hand compressed between the hoarse and the stall wall.  Now with swelling over the 3rd and 4th mcp area with bruising   Patient is a 22 y.o. female presenting with hand injury. The history is provided by the patient.  Hand Injury  The incident occurred 1 to 2 hours ago. The injury mechanism was compression. The pain is present in the right hand. The quality of the pain is described as aching. The pain is at a severity of 3/10. The pain is moderate. The pain has been constant since the incident. The symptoms are aggravated by movement. She has tried nothing for the symptoms.    Past Medical History  Diagnosis Date  . Asthma   . Bipolar 1 disorder   . Anxiety   . Depression     Past Surgical History  Procedure Date  . Tonsillectomy   . Bone spur 2007,2008    Family History  Problem Relation Age of Onset  . Breast cancer Maternal Grandmother   . Breast cancer Paternal Grandmother   . Heart failure Maternal Grandfather     History  Substance Use Topics  . Smoking status: Former Games developer  . Smokeless tobacco: Former Neurosurgeon  . Alcohol Use: Yes     Comment: occasionally    OB History    Grav Para Term Preterm Abortions TAB SAB Ect Mult Living   0               Review of Systems  HENT: Negative.   Eyes: Negative.   Respiratory: Negative.   Cardiovascular: Negative.   Gastrointestinal: Negative.   Genitourinary: Negative.   Musculoskeletal: Positive for joint swelling.  Skin: Positive for color change. Negative for rash and wound.  Neurological: Negative for weakness and numbness.     Allergies  Valium and Sulfa antibiotics  Home Medications   Current Outpatient Rx  Name  Route  Sig  Dispense  Refill  . ACETYLCYSTEINE 600 MG PO CAPS   Oral   Take 1 capsule by mouth daily.          Marland Kitchen BIOTIN 5000 MCG PO TABS   Oral   Take 1 tablet by mouth daily. For dry mouth         . FEXOFENADINE HCL 60 MG PO TABS   Oral   Take 60 mg by mouth daily.         Marland Kitchen LAMOTRIGINE 150 MG PO TABS   Oral   Take 150 mg by mouth daily.         Marland Kitchen LORAZEPAM 0.5 MG PO TABS   Oral   Take 1 tablet (0.5 mg total) by mouth daily as needed. For anxiety   30 tablet      . MELOXICAM 7.5 MG PO TABS   Oral   Take 1 tablet (7.5 mg total) by mouth daily.   30 tablet   0   . ONE-DAILY MULTI VITAMINS PO TABS   Oral   Take 1 tablet by mouth daily. For nutritional supplementation.   30 tablet   0   . NORETHIN-ETH ESTRAD-FE BIPHAS 1 MG-10 MCG /  10 MCG PO TABS   Oral   Take 1 tablet by mouth daily.   3 Package   4     Dispense as written.   Marland Kitchen RISPERIDONE 0.25 MG PO TABS   Oral   Take 0.25 mg by mouth at bedtime.         . IBUPROFEN 800 MG PO TABS   Oral   Take 1 tablet (800 mg total) by mouth every 8 (eight) hours as needed for pain.   30 tablet   0     BP 131/87  Pulse 98  Temp 98 F (36.7 C) (Oral)  Resp 20  SpO2 100%  LMP 07/04/2012  Physical Exam  Constitutional: She appears well-developed and well-nourished.  Eyes: Pupils are equal, round, and reactive to light.  Neck: Normal range of motion.  Cardiovascular: Normal rate.   Pulmonary/Chest: Effort normal.  Musculoskeletal: She exhibits tenderness. She exhibits no edema.       Arms:      Hands:      Bruising   Neurological: She is alert.  Skin: Skin is warm. No erythema.    ED Course  Procedures (including critical care time)  Labs Reviewed - No data to display Dg Hand Complete Right  07/15/2012  *RADIOLOGY REPORT*  Clinical Data: Right hand pain mostly at the second MCP area after  injury.  RIGHT HAND - COMPLETE 3+ VIEW  Comparison: None.  Findings: The right hand appears intact. No evidence of acute fracture or subluxation.  No focal bone lesions.  Bone matrix and cortex appear intact.  No abnormal radiopaque densities in the soft tissues.  IMPRESSION: No acute bony abnormalities.   Original Report Authenticated By: Burman Nieves, M.D.      1. Contusion of hand, right       MDM  Reviewed xray will apply ACE recommend patient follow RICE protocol for the next several days         Arman Filter, NP 07/16/12 0031  Arman Filter, NP 07/16/12 8295

## 2012-07-17 NOTE — ED Provider Notes (Signed)
Medical screening examination/treatment/procedure(s) were performed by non-physician practitioner and as supervising physician I was immediately available for consultation/collaboration.   Quinita Kostelecky M Gurjit Loconte, DO 07/17/12 1925 

## 2012-07-30 ENCOUNTER — Other Ambulatory Visit: Payer: Self-pay

## 2012-07-31 IMAGING — CT CT CERVICAL SPINE W/O CM
4 of 7 series · 13 of 33 positions shown, 14 images · non-contrast
Comparison: None.

CLINICAL DATA: Pain post fall from horse

CT CERVICAL SPINE WITHOUT CONTRAST
TECHNIQUE: Multidetector CT imaging of the cervical spine was
performed. Multiplanar CT image reconstructions were also
generated.

[Series 2: cervical spine · axial · 0.23mm/px · z∈[+107,+162]mm · 2 of 68 slices shown]
[im 23/68  bone]
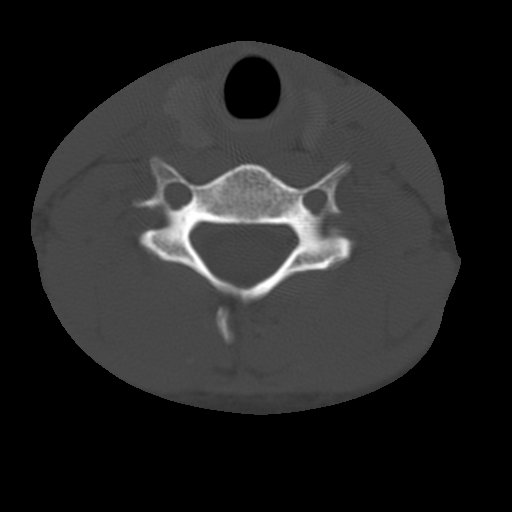
[im 45/68  bone]
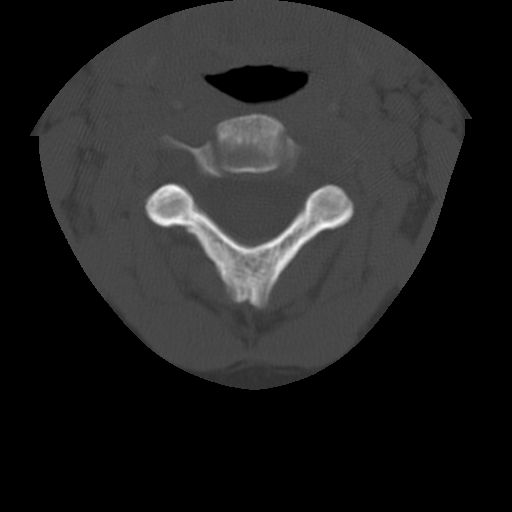

[Series 3: recon 2: cervical spine · axial · 0.23mm/px · z∈[+92,+177]mm · 3 of 68 slices shown, 4 images]
[im 17/68  soft-tissue]
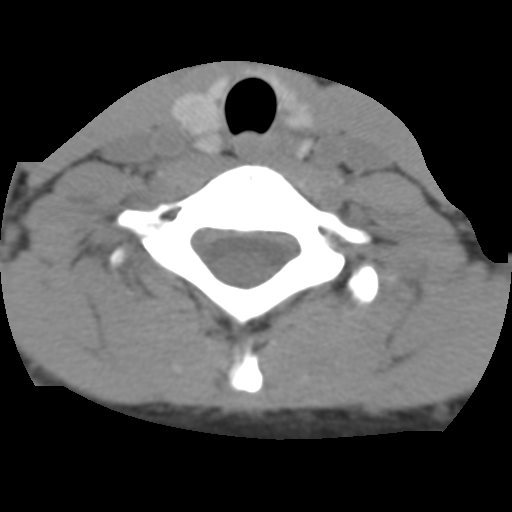
[im 17/68  bone]
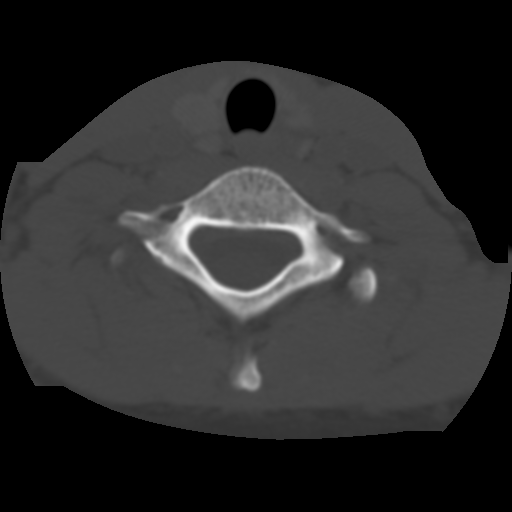
[im 34/68  bone]
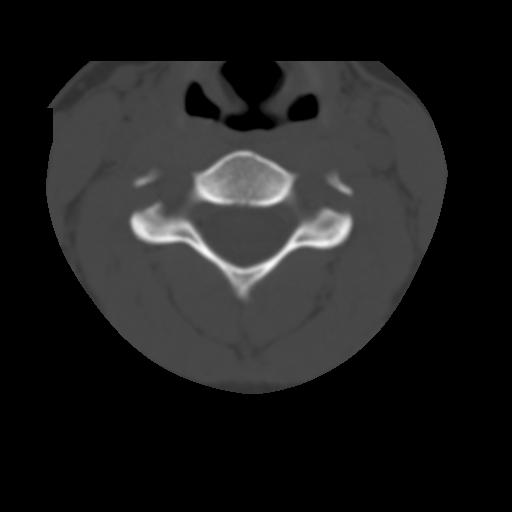
[im 51/68  bone]
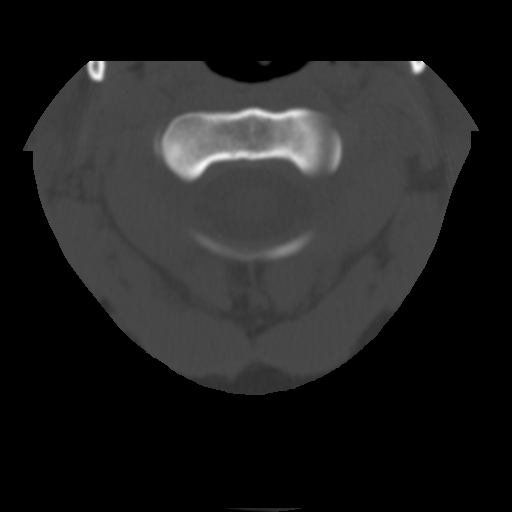

[Series 400: sagittal c-spine · sagittal · 0.34mm/px · 5 of 33 slices shown]
[im 6/33  bone]
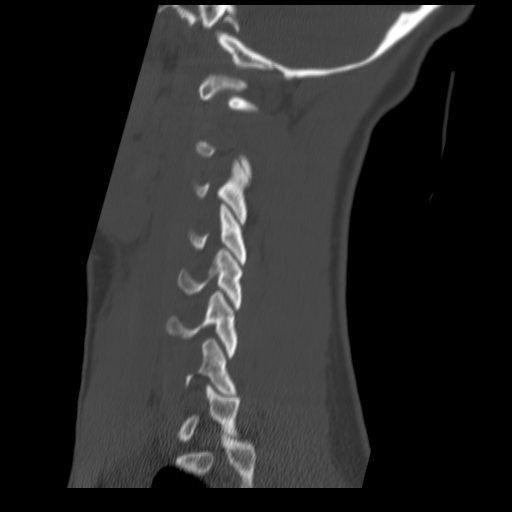
[im 11/33  bone]
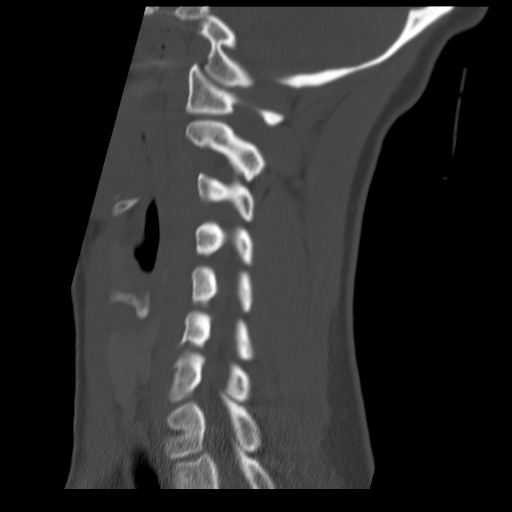
[im 17/33  bone]
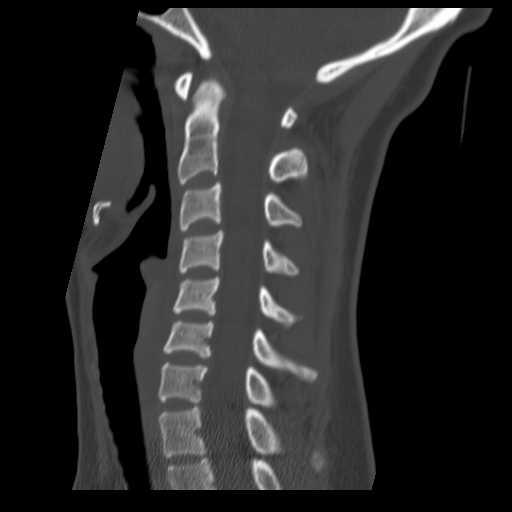
[im 22/33  bone]
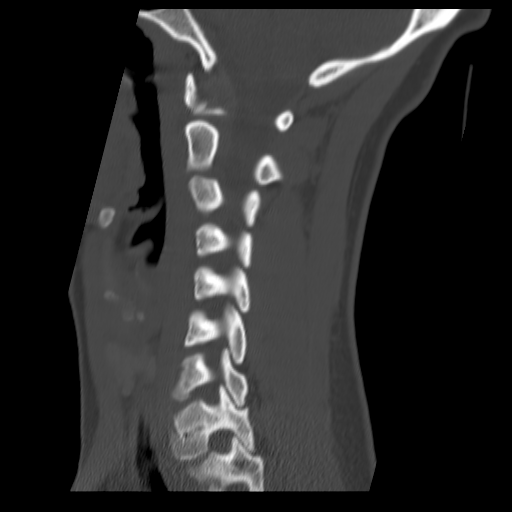
[im 27/33  bone]
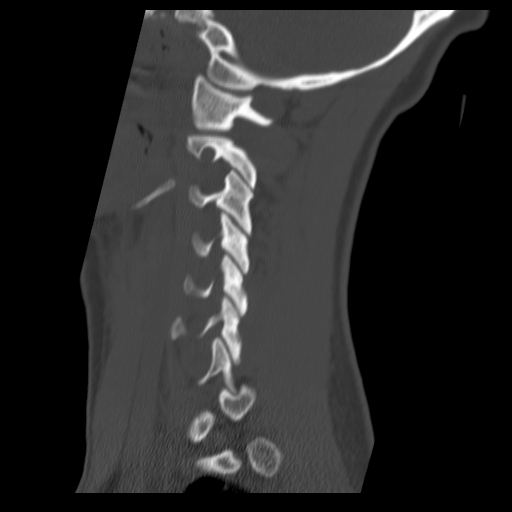

[Series 402: coronal c-spine · coronal · 0.34mm/px · 3 of 32 slices shown]
[im 7/32  bone]
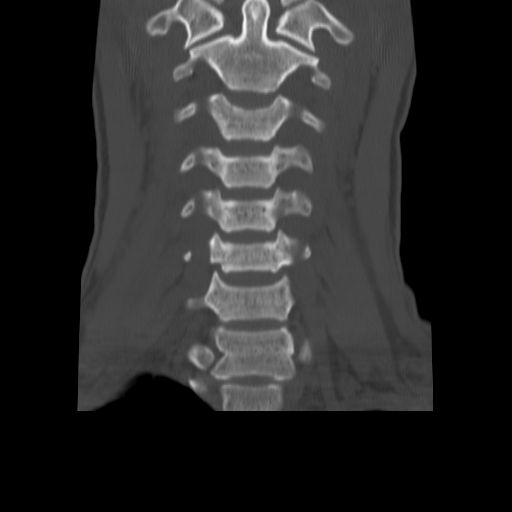
[im 13/32  bone]
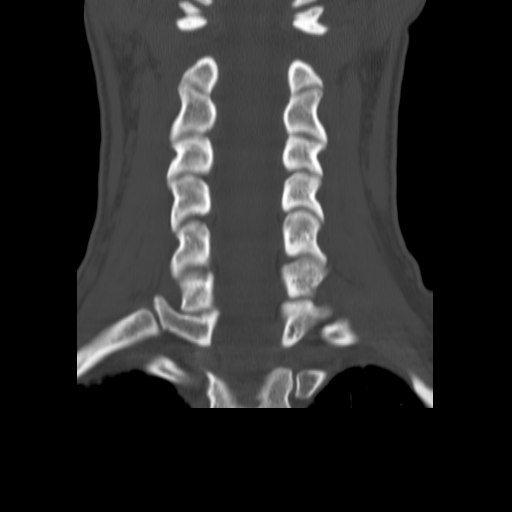
[im 19/32  bone]
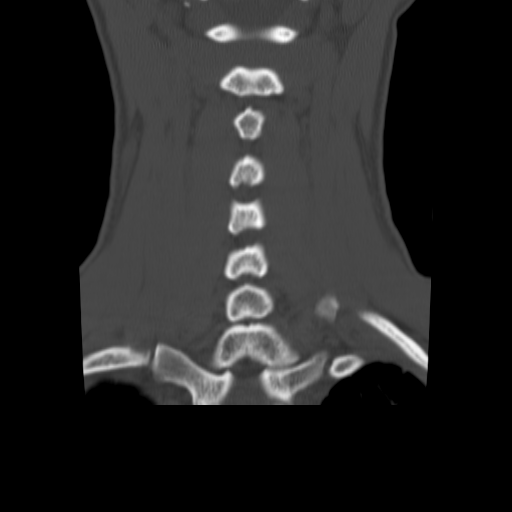

[13 of 33 positions shown; findings below may reference images not displayed]

FINDINGS: There is mild reversal of the normal cervical lordosis.
Facets seated bilaterally.  There is no prevertebral soft tissue
swelling.  Vertebral body and intervertebral disc height well
maintained throughout.  Negative for fracture.  No significant bony
degenerative change.  Visualized lung apices clear.  Regional soft
tissues unremarkable.
IMPRESSION: 1.  Negative for fracture or other acute bone injury.
2. Loss of the normal cervical spine lordosis, which may be
secondary to positioning, spasm, or soft tissue injury.

## 2013-02-06 ENCOUNTER — Encounter: Payer: Self-pay | Admitting: Gynecology

## 2013-02-06 ENCOUNTER — Ambulatory Visit (INDEPENDENT_AMBULATORY_CARE_PROVIDER_SITE_OTHER): Payer: PRIVATE HEALTH INSURANCE | Admitting: Gynecology

## 2013-02-06 VITALS — BP 104/68

## 2013-02-06 DIAGNOSIS — R5383 Other fatigue: Secondary | ICD-10-CM

## 2013-02-06 DIAGNOSIS — N83209 Unspecified ovarian cyst, unspecified side: Secondary | ICD-10-CM | POA: Insufficient documentation

## 2013-02-06 DIAGNOSIS — R635 Abnormal weight gain: Secondary | ICD-10-CM

## 2013-02-06 DIAGNOSIS — Z8349 Family history of other endocrine, nutritional and metabolic diseases: Secondary | ICD-10-CM

## 2013-02-06 DIAGNOSIS — N915 Oligomenorrhea, unspecified: Secondary | ICD-10-CM

## 2013-02-06 DIAGNOSIS — Z3041 Encounter for surveillance of contraceptive pills: Secondary | ICD-10-CM

## 2013-02-06 DIAGNOSIS — R5381 Other malaise: Secondary | ICD-10-CM

## 2013-02-06 LAB — CBC WITH DIFFERENTIAL/PLATELET
Basophils Absolute: 0 10*3/uL (ref 0.0–0.1)
Basophils Relative: 0 % (ref 0–1)
Eosinophils Absolute: 0.8 10*3/uL — ABNORMAL HIGH (ref 0.0–0.7)
Hemoglobin: 14.7 g/dL (ref 12.0–15.0)
MCH: 29.2 pg (ref 26.0–34.0)
MCHC: 34.8 g/dL (ref 30.0–36.0)
Monocytes Relative: 10 % (ref 3–12)
Neutro Abs: 3.1 10*3/uL (ref 1.7–7.7)
Neutrophils Relative %: 42 % — ABNORMAL LOW (ref 43–77)
Platelets: 324 10*3/uL (ref 150–400)
RBC: 5.04 MIL/uL (ref 3.87–5.11)

## 2013-02-06 MED ORDER — DROSPIRENONE-ETHINYL ESTRADIOL 3-0.03 MG PO TABS: 1.0000 | ORAL_TABLET | Freq: Every day | ORAL | Status: DC

## 2013-02-06 MED ORDER — MEDROXYPROGESTERONE ACETATE 10 MG PO TABS: 10.0000 mg | ORAL_TABLET | Freq: Every day | ORAL | Status: DC

## 2013-02-06 NOTE — Patient Instructions (Addendum)
Drospirenone; Ethinyl Estradiol tablets What is this medicine? DROSPIRENONE; ETHINYL ESTRADIOL (dro SPY re nown; ETH in il es tra DYE ole) is an oral contraceptive (birth control pill). This medicine combines two types of female hormones, an estrogen and a progestin. It is used to prevent ovulation and pregnancy. This medicine may be used for other purposes; ask your health care provider or pharmacist if you have questions. What should I tell my health care provider before I take this medicine? They need to know if you have or ever had any of these conditions: -abnormal vaginal bleeding -adrenal gland disease -blood vessel disease or blood clots -breast, cervical, endometrial, ovarian, liver, or uterine cancer -diabetes -gallbladder disease -heart disease or recent heart attack -high blood pressure -high cholesterol -high potassium level -kidney disease -liver disease -migraine headaches -stroke -systemic lupus erythematosus (SLE) -tobacco smoker -an unusual or allergic reaction to estrogens, progestins, or other medicines, foods, dyes, or preservatives -pregnant or trying to get pregnant -breast-feeding How should I use this medicine? Take this medicine by mouth. To reduce nausea, this medicine may be taken with food. Follow the directions on the prescription label. Take this medicine at the same time each day and in the order directed on the package. Do not take your medicine more often than directed. A patient package insert for the product will be given with each prescription and refill. Read this sheet carefully each time. The sheet may change frequently. Talk to your pediatrician regarding the use of this medicine in children. Special care may be needed. This medicine has been used in female children who have started having menstrual periods. Overdosage: If you think you have taken too much of this medicine contact a poison control center or emergency room at once. NOTE: This  medicine is only for you. Do not share this medicine with others. What if I miss a dose? If you miss a dose, refer to the patient information sheet you received with your medicine for direction. If you miss more than one pill, this medicine may not be as effective and you may need to use another form of birth control. What may interact with this medicine? -acetaminophen -antibiotics or medicines for infections, especially rifampin, rifabutin, rifapentine, and griseofulvin, and possibly penicillins or tetracyclines -aprepitant -ascorbic acid (vitamin C) -atorvastatin -barbiturate medicines, such as phenobarbital -bosentan -carbamazepine -caffeine -clofibrate -cyclosporine -dantrolene -doxercalciferol -felbamate -grapefruit juice -hydrocortisone -medicines for anxiety or sleeping problems, such as diazepam or temazepam -medicines for diabetes, including pioglitazone -mineral oil -modafinil -mycophenolate -nefazodone -oxcarbazepine -phenytoin -prednisolone -ritonavir or other medicines for HIV infection or AIDS -rosuvastatin -selegiline -soy isoflavones supplements -St. John's wort -tamoxifen or raloxifene -theophylline -thyroid hormones -topiramate -warfarin This product is different from other birth control pills because it contains the progestin drospirenone. Drospirenone may increase potassium levels. Interactions with other drugs may increase the chance of an elevated potassium level. You may need blood tests to check your potassium level. Drugs that can increase the potassium level include: -certain medications for high blood pressure or heart conditions (examples include ACE-inhibitors and also Angiotensin-II receptor blockers, and Eplerenone -dietary salt substitutes (these may contain potassium) -heparin -NSAIDs (antiinflammatory drugs), if they are taken long-term and daily, like for arthritis -potassium supplements -some 'water pills' (diuretics like amiloride,  spironolactone or triamterene) This list may not describe all possible interactions. Give your health care provider a list of all the medicines, herbs, non-prescription drugs, or dietary supplements you use. Also tell them if you smoke, drink alcohol, or use illegal   drugs. Some items may interact with your medicine. What should I watch for while using this medicine? Visit your doctor or health care professional for regular checks on your progress. You will need a regular breast and pelvic exam and Pap smear while on this medicine. Use an additional method of contraception during the first cycle that you take these tablets. If you have any reason to think you are pregnant, stop taking this medicine right away and contact your doctor or health care professional. If you are taking this medicine for hormone related problems, it may take several cycles of use to see improvement in your condition. Smoking increases the risk of getting a blood clot or having a stroke while you are taking birth control pills, especially if you are more than 24 years old. You are strongly advised not to smoke. This medicine can make your body retain fluid, making your fingers, hands, or ankles swell. Your blood pressure can go up. Contact your doctor or health care professional if you feel you are retaining fluid. This medicine can make you more sensitive to the sun. Keep out of the sun. If you cannot avoid being in the sun, wear protective clothing and use sunscreen. Do not use sun lamps or tanning beds/booths. If you wear contact lenses and notice visual changes, or if the lenses begin to feel uncomfortable, consult your eye care specialist. In some women, tenderness, swelling, or minor bleeding of the gums may occur. Notify your dentist if this happens. Brushing and flossing your teeth regularly may help limit this. See your dentist regularly and inform your dentist of the medicines you are taking. If you are going to have  elective surgery, you may need to stop taking this medicine before the surgery. Consult your health care professional for advice. This medicine does not protect you against HIV infection (AIDS) or any other sexually transmitted diseases. What side effects may I notice from receiving this medicine? Side effects that you should report to your doctor or health care professional as soon as possible: -allergic reactions like skin rash, itching or hives, swelling of the face, lips, or tongue -breast tissue changes or discharge -changes in vision -chest pain -confusion, trouble speaking or understanding -dark urine -general ill feeling or flu-like symptoms -light-colored stools -nausea, vomiting -pain, swelling, warmth in the leg -right upper belly pain -severe headaches -shortness of breath -sudden numbness or weakness of the face, arm or leg -trouble walking, dizziness, loss of balance or coordination -unusual vaginal bleeding -yellowing of the eyes or skin Side effects that usually do not require medical attention (report to your doctor or health care professional if they continue or are bothersome): -acne -brown spots on the face -change in appetite -change in sexual desire -depressed mood or mood swings -fluid retention and swelling -stomach cramps or bloating -unusually weak or tired -weight gain This list may not describe all possible side effects. Call your doctor for medical advice about side effects. You may report side effects to FDA at 1-800-FDA-1088. Where should I keep my medicine? Keep out of the reach of children. Store at room temperature between 15 and 30 degrees C (59 and 86 degrees F). Throw away any unused medicine after the expiration date. NOTE: This sheet is a summary. It may not cover all possible information. If you have questions about this medicine, talk to your doctor, pharmacist, or health care provider.  2013, [employer]. (05/17/2008 1:02:54  PM)

## 2013-02-06 NOTE — Progress Notes (Signed)
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 She has been on low low Estrin oral contraceptive pill and finish that month and has not had a menstrual cycle female for one month. She was sexually active and uses condoms before her last menstrual period. She had complained of some abdominal cramps like she is getting ready to start her cycle and some breast tenderness. She is also complaining that she has been feeling tired and some weight gain and has family history of thyroid disease and wanted to have her thyroid checked as well. She stated that she wanted to change her oral contraceptive pill because of her weight gain and chin hirsutism.  We discussed different contraception options. I explained to her that by now she would've worn off the effects of the progesterone spill that she had on her arm 2-1/2 months ago. Prior to being on oral contraceptive pills she has a history of oligomenorrhea. She denied any visual disturbances or unusual headache or galactorrhea.  We will check today TSH, CBC, prolactin and vitamin D level. She will be prescribed Provera 10 mg to take 1 by mouth daily for the next 5-10 days to start her menses. We discussed switching her over to the lowest and can containing oral contraceptive pills such as Ocella ( the equivalent to [place]). We discussed potential risk of DVT especially with progesterone component- despironone. Patient does not smoke and denies any family history of a clotting disorder. Patient fully understands and accepts.  The patient stated that she was in [place] several months ago and was having some back discomfort and an MRI was done and informed her that she had some ovarian cysts. She is going to obtain that record them to return to see [place] for an ultrasound to compare and discuss findings. She complains of occasional abdominal pelvic twitching  sensation.

## 2013-02-07 LAB — VITAMIN D 25 HYDROXY (VIT D DEFICIENCY, FRACTURES): Vit D, 25-Hydroxy: 41 ng/mL (ref 30–89)

## 2013-02-07 LAB — TSH: TSH: 1.84 u[IU]/mL (ref 0.350–4.500)

## 2013-02-08 ENCOUNTER — Other Ambulatory Visit: Payer: Self-pay | Admitting: Gynecology

## 2013-02-08 DIAGNOSIS — R898 Other abnormal findings in specimens from other organs, systems and tissues: Secondary | ICD-10-CM

## 2013-02-15 ENCOUNTER — Other Ambulatory Visit: Payer: PRIVATE HEALTH INSURANCE

## 2013-02-23 ENCOUNTER — Encounter: Payer: Self-pay | Admitting: Gynecology

## 2013-02-23 ENCOUNTER — Ambulatory Visit (INDEPENDENT_AMBULATORY_CARE_PROVIDER_SITE_OTHER): Payer: PRIVATE HEALTH INSURANCE | Admitting: Gynecology

## 2013-02-23 ENCOUNTER — Ambulatory Visit (INDEPENDENT_AMBULATORY_CARE_PROVIDER_SITE_OTHER): Payer: PRIVATE HEALTH INSURANCE

## 2013-02-23 VITALS — BP 120/74

## 2013-02-23 DIAGNOSIS — D721 Eosinophilia, unspecified: Secondary | ICD-10-CM

## 2013-02-23 DIAGNOSIS — N83209 Unspecified ovarian cyst, unspecified side: Secondary | ICD-10-CM

## 2013-02-23 DIAGNOSIS — R898 Other abnormal findings in specimens from other organs, systems and tissues: Secondary | ICD-10-CM

## 2013-02-23 LAB — CBC WITH DIFFERENTIAL/PLATELET
Basophils Absolute: 0 10*3/uL (ref 0.0–0.1)
Basophils Relative: 1 % (ref 0–1)
Eosinophils Absolute: 0.5 10*3/uL (ref 0.0–0.7)
Eosinophils Relative: 8 % — ABNORMAL HIGH (ref 0–5)
Lymphs Abs: 2.2 10*3/uL (ref 0.7–4.0)
MCH: 29.5 pg (ref 26.0–34.0)
MCHC: 34.6 g/dL (ref 30.0–36.0)
MCV: 85.1 fL (ref 78.0–100.0)
Neutrophils Relative %: 48 % (ref 43–77)
Platelets: 334 10*3/uL (ref 150–400)
RBC: 4.82 MIL/uL (ref 3.87–5.11)
RDW: 13.4 % (ref 11.5–15.5)

## 2013-02-23 NOTE — Progress Notes (Signed)
22 year old presented to the office for an ultrasound. Please see last encounter note dated 02/06/2013. Patient with past history of oligomenorrhea.The patient stated that she was in [place] several months ago and was having some back discomfort and an MRI was done and informed her that she had some ovarian cysts.patient's recent lab work consisted of TSH, CBC, prolactin vitamin D level all were normal with the exception of slight elevated eosinophil with a value of 10. Patient does have history of allergies. CBC will be repeated today. Patient was otherwise doing well today. Patient had been given Provera 10 mg for 10 days which she withdrew and started the Ocella oral contraceptive pill for contraception and cycle control.  Ultrasound today: Uterus measures 6.6 x 4.2 x 2.8 cm with endometrial stripe 7 mm. Both ovaries were normal. No abnormalities noted.  Assessment/plan: Patient with history of oligomenorrhea with her with Provera initiated Ocella oral contraceptive pills for cycle control and contraception. Ultrasound today normal. Patient scheduled to return back again in the year for her annual exam or when necessary.

## 2013-04-20 ENCOUNTER — Other Ambulatory Visit: Payer: Self-pay

## 2013-07-04 ENCOUNTER — Other Ambulatory Visit (HOSPITAL_COMMUNITY)
Admission: RE | Admit: 2013-07-04 | Discharge: 2013-07-04 | Disposition: A | Discharge status: Home / Self Care | Payer: PRIVATE HEALTH INSURANCE | Source: Ambulatory Visit | Attending: Gynecology | Admitting: Gynecology

## 2013-07-04 ENCOUNTER — Encounter: Payer: Self-pay | Admitting: Gynecology

## 2013-07-04 ENCOUNTER — Ambulatory Visit (INDEPENDENT_AMBULATORY_CARE_PROVIDER_SITE_OTHER): Payer: PRIVATE HEALTH INSURANCE | Admitting: Gynecology

## 2013-07-04 VITALS — BP 110/76 | Ht 63.0 in | Wt 124.0 lb

## 2013-07-04 DIAGNOSIS — Z113 Encounter for screening for infections with a predominantly sexual mode of transmission: Secondary | ICD-10-CM

## 2013-07-04 DIAGNOSIS — B9689 Other specified bacterial agents as the cause of diseases classified elsewhere: Secondary | ICD-10-CM

## 2013-07-04 DIAGNOSIS — A499 Bacterial infection, unspecified: Secondary | ICD-10-CM

## 2013-07-04 DIAGNOSIS — Z01419 Encounter for gynecological examination (general) (routine) without abnormal findings: Secondary | ICD-10-CM

## 2013-07-04 DIAGNOSIS — N760 Acute vaginitis: Secondary | ICD-10-CM

## 2013-07-04 DIAGNOSIS — Z23 Encounter for immunization: Secondary | ICD-10-CM

## 2013-07-04 DIAGNOSIS — Z309 Encounter for contraceptive management, unspecified: Secondary | ICD-10-CM

## 2013-07-04 DIAGNOSIS — N898 Other specified noninflammatory disorders of vagina: Secondary | ICD-10-CM

## 2013-07-04 LAB — CBC WITH DIFFERENTIAL/PLATELET
Basophils Absolute: 0 10*3/uL (ref 0.0–0.1)
Basophils Relative: 0 % (ref 0–1)
EOS ABS: 0.6 10*3/uL (ref 0.0–0.7)
EOS PCT: 8 % — AB (ref 0–5)
HCT: 43.2 % (ref 36.0–46.0)
HEMOGLOBIN: 14.4 g/dL (ref 12.0–15.0)
LYMPHS ABS: 2.4 10*3/uL (ref 0.7–4.0)
Lymphocytes Relative: 35 % (ref 12–46)
MCH: 29.3 pg (ref 26.0–34.0)
MCHC: 33.3 g/dL (ref 30.0–36.0)
MCV: 88 fL (ref 78.0–100.0)
MONOS PCT: 11 % (ref 3–12)
Monocytes Absolute: 0.7 10*3/uL (ref 0.1–1.0)
NEUTROS PCT: 46 % (ref 43–77)
Neutro Abs: 3.1 10*3/uL (ref 1.7–7.7)
Platelets: 349 10*3/uL (ref 150–400)
RBC: 4.91 MIL/uL (ref 3.87–5.11)
RDW: 13.2 % (ref 11.5–15.5)
WBC: 6.7 10*3/uL (ref 4.0–10.5)

## 2013-07-04 LAB — WET PREP FOR TRICH, YEAST, CLUE
TRICH WET PREP: NONE SEEN
YEAST WET PREP: NONE SEEN

## 2013-07-04 MED ORDER — ETONOGESTREL-ETHINYL ESTRADIOL 0.12-0.015 MG/24HR VA RING: VAGINAL_RING | VAGINAL | Status: DC

## 2013-07-04 MED ORDER — METRONIDAZOLE 500 MG PO TABS: 500.0000 mg | ORAL_TABLET | Freq: Two times a day (BID) | ORAL | Status: DC

## 2013-07-04 NOTE — Patient Instructions (Addendum)
H1N1 Influenza (swine flu) Vaccine injection What is this medicine? H1N1 INFLUENZA (SWINE FLU) VACCINE (H1N1 in floo EN zuh (swahyn floo) vak SEEN) is a vaccine to protect from an infection with the pandemic H1N1 flu, also known as the swine flu. The vaccine only helps protect you against this one strain of the flu. This vaccine does not help to the reduce the risk of getting other types of flu. You may also need to get the seasonal influenza virus vaccine. This medicine may be used for other purposes; ask your health care provider or pharmacist if you have questions. COMMON BRAND NAME(S): Influenza A (H1N1) 2009 Monovalent Vaccine What should I tell my health care provider before I take this medicine? They need to know if you have any of these conditions: -Guillain-Barre syndrome -immune system problems -an unusual or allergic reaction to influenza vaccine, eggs, neomycin, polymyxin, other medicines, foods, dyes or preservatives -pregnant or trying to get pregnant -breast-feeding How should I use this medicine? This vaccine is for injection into a muscle. It is given by a health care professional. A copy of Vaccine Information Statements will be given before each vaccination. Read this sheet carefully each time. The sheet may change frequently. Talk to your pediatrician regarding the use of this medicine in children. Special care may be needed. While this drug may be prescribed for children as young as 6 months for selected conditions, precautions do apply. Overdosage: If you think you've taken too much of this medicine contact a poison control center or emergency room at once. Overdosage: If you think you have taken too much of this medicine contact a poison control center or emergency room at once. NOTE: This medicine is only for you. Do not share this medicine with others. What if I miss a dose? If needed, keep appointments for follow-up (booster) doses as directed. It is important not to  miss your dose. Call your doctor or health care professional if you are unable to keep an appointment. What may interact with this medicine? -anakinra -medicines for organ transplant -medicines to treat cancer -other vaccines -rilonacept -steroid medicines like prednisone or cortisone -tumor necrosis factor (TNF) modifiers like adalimumab, etanercept, infliximab, golimumab, or certolizumab This list may not describe all possible interactions. Give your health care provider a list of all the medicines, herbs, non-prescription drugs, or dietary supplements you use. Also tell them if you smoke, drink alcohol, or use illegal drugs. Some items may interact with your medicine. What should I watch for while using this medicine? Report any side effects to your doctor right away. This vaccine lowers your risk of getting the pandemic H1N1 flu. You can get a milder H1N1 flu infection if you are around others with this flu. This flu vaccine will not protect against colds or other illnesses including other flu viruses. You may also need the seasonal influenza vaccine. What side effects may I notice from receiving this medicine? Side effects that you should report to your doctor or health care professional as soon as possible: -allergic reactions like skin rash, itching or hives, swelling of the face, lips, or tongue -breathing problems -muscle weakness -unusual drooping or paralysis of face Side effects that usually do not require medical attention (Report these to your doctor or health care professional if they continue or are bothersome.): -chills -cough -headache -muscle aches and pains -runny or stuffy nose -sore throat -stomach upset -tiredness This list may not describe all possible side effects. Call your doctor for medical advice about   side effects. You may report side effects to FDA at 1-800-FDA-1088. Where should I keep my medicine? This vaccine is only given in a clinic, pharmacy,  doctor's office, or other health care setting and will not be stored at home. NOTE: This sheet is a summary. It may not cover all possible information. If you have questions about this medicine, talk to your doctor, pharmacist, or health care provider.  2014, Elsevier/Gold Standard. (2008-05-01 16:49:51) Ethinyl Estradiol; Etonogestrel vaginal ring What is this medicine? ETHINYL ESTRADIOL; ETONOGESTREL (ETH in il es tra DYE ole; et oh noe JES trel) vaginal ring is a flexible, vaginal ring used as a contraceptive (birth control method). This medicine combines two types of female hormones, an estrogen and a progestin. This ring is used to prevent ovulation and pregnancy. Each ring is effective for one month. This medicine may be used for other purposes; ask your health care provider or pharmacist if you have questions. COMMON BRAND NAME(S): NuvaRing What should I tell my health care provider before I take this medicine? They need to know if you have or ever had any of these conditions: -abnormal vaginal bleeding -blood vessel disease or blood clots -breast, cervical, endometrial, ovarian, liver, or uterine cancer -diabetes -gallbladder disease -heart disease or recent heart attack -high blood pressure -high cholesterol -kidney disease -liver disease -migraine headaches -stroke -systemic lupus erythematosus (SLE) -tobacco smoker -an unusual or allergic reaction to estrogens, progestins, other medicines, foods, dyes, or preservatives -pregnant or trying to get pregnant -breast-feeding How should I use this medicine? Insert the ring into your vagina as directed. Follow the directions on the prescription label. The ring will remain place for 3 weeks and is then removed for a 1-week break. A new ring is inserted 1 week after the last ring was removed, on the same day of the week. Do not use more often than directed. A patient package insert for the product will be given with each prescription  and refill. Read this sheet carefully each time. The sheet may change frequently. Contact your pediatrician regarding the use of this medicine in children. Special care may be needed. This medicine has been used in female children who have started having menstrual periods. Overdosage: If you think you have taken too much of this medicine contact a poison control center or emergency room at once. NOTE: This medicine is only for you. Do not share this medicine with others. What if I miss a dose? You will need to replace your vaginal ring once a month as directed. If the ring should slip out, or if you leave it in longer or shorter than you should, contact your health care professional for advice. What may interact with this medicine? -acetaminophen -antibiotics or medicines for infections, especially rifampin, rifabutin, rifapentine, and griseofulvin, and possibly penicillins or tetracyclines -aprepitant -ascorbic acid (vitamin C) -atorvastatin -barbiturate medicines, such as phenobarbital -bosentan -carbamazepine -caffeine -clofibrate -cyclosporine -dantrolene -doxercalciferol -felbamate -grapefruit juice -hydrocortisone -medicines for anxiety or sleeping problems, such as diazepam or temazepam -medicines for diabetes, including pioglitazone -modafinil -mycophenolate -nefazodone -oxcarbazepine -phenytoin -prednisolone -ritonavir or other medicines for HIV infection or AIDS -rosuvastatin -selegiline -soy isoflavones supplements -St. John's wort -tamoxifen or raloxifene -theophylline -thyroid hormones -topiramate -warfarin This list may not describe all possible interactions. Give your health care provider a list of all the medicines, herbs, non-prescription drugs, or dietary supplements you use. Also tell them if you smoke, drink alcohol, or use illegal drugs. Some items may interact with your medicine. What should I  watch for while using this medicine? Visit your doctor or  health care professional for regular checks on your progress. You will need a regular breast and pelvic exam and Pap smear while on this medicine. Use an additional method of contraception during the first cycle that you use this ring. If you have any reason to think you are pregnant, stop using this medicine right away and contact your doctor or health care professional. If you are using this medicine for hormone related problems, it may take several cycles of use to see improvement in your condition. Smoking increases the risk of getting a blood clot or having a stroke while you are using hormonal birth control, especially if you are more than 25 years old. You are strongly advised not to smoke. This medicine can make your body retain fluid, making your fingers, hands, or ankles swell. Your blood pressure can go up. Contact your doctor or health care professional if you feel you are retaining fluid. This medicine can make you more sensitive to the sun. Keep out of the sun. If you cannot avoid being in the sun, wear protective clothing and use sunscreen. Do not use sun lamps or tanning beds/booths. If you wear contact lenses and notice visual changes, or if the lenses begin to feel uncomfortable, consult your eye care specialist. In some women, tenderness, swelling, or minor bleeding of the gums may occur. Notify your dentist if this happens. Brushing and flossing your teeth regularly may help limit this. See your dentist regularly and inform your dentist of the medicines you are taking. If you are going to have elective surgery, you may need to stop using this medicine before the surgery. Consult your health care professional for advice. This medicine does not protect you against HIV infection (AIDS) or any other sexually transmitted diseases. What side effects may I notice from receiving this medicine? Side effects that you should report to your doctor or health care professional as soon as  possible: -breast tissue changes or discharge -changes in vaginal bleeding during your period or between your periods -chest pain -coughing up blood -dizziness or fainting spells -headaches or migraines -leg, arm or groin pain -severe or sudden headaches -stomach pain (severe) -sudden shortness of breath -sudden loss of coordination, especially on one side of the body -speech problems -symptoms of vaginal infection like itching, irritation or unusual discharge -tenderness in the upper abdomen -vomiting -weakness or numbness in the arms or legs, especially on one side of the body -yellowing of the eyes or skin Side effects that usually do not require medical attention (report to your doctor or health care professional if they continue or are bothersome): -breakthrough bleeding and spotting that continues beyond the 3 initial cycles of pills -breast tenderness -mood changes, anxiety, depression, frustration, anger, or emotional outbursts -increased sensitivity to sun or ultraviolet light -nausea -skin rash, acne, or brown spots on the skin -weight gain (slight) This list may not describe all possible side effects. Call your doctor for medical advice about side effects. You may report side effects to FDA at 1-800-FDA-1088. Where should I keep my medicine? Keep out of the reach of children. Store at room temperature between 15 and 30 degrees C (59 and 86 degrees F) for up to 4 months. The product will expire after 4 months. Protect from light. Throw away any unused medicine after the expiration date. NOTE: This sheet is a summary. It may not cover all possible information. If you have questions about this  medicine, talk to your doctor, pharmacist, or health care provider.  2014, Elsevier/Gold Standard. (2008-05-17 12:03:58) Bacterial Vaginosis Bacterial vaginosis is a vaginal infection that occurs when the normal balance of bacteria in the vagina is disrupted. It results from an  overgrowth of certain bacteria. This is the most common vaginal infection in women of childbearing age. Treatment is important to prevent complications, especially in pregnant women, as it can cause a premature delivery. CAUSES  Bacterial vaginosis is caused by an increase in harmful bacteria that are normally present in smaller amounts in the vagina. Several different kinds of bacteria can cause bacterial vaginosis. However, the reason that the condition develops is not fully understood. RISK FACTORS Certain activities or behaviors can put you at an increased risk of developing bacterial vaginosis, including:  Having a new sex partner or multiple sex partners.  Douching.  Using an intrauterine device (IUD) for contraception. Women do not get bacterial vaginosis from toilet seats, bedding, swimming pools, or contact with objects around them. SIGNS AND SYMPTOMS  Some women with bacterial vaginosis have no signs or symptoms. Common symptoms include:  Grey vaginal discharge.  A fishlike odor with discharge, especially after sexual intercourse.  Itching or burning of the vagina and vulva.  Burning or pain with urination. DIAGNOSIS  Your health care provider will take a medical history and examine the vagina for signs of bacterial vaginosis. A sample of vaginal fluid may be taken. Your health care provider will look at this sample under a microscope to check for bacteria and abnormal cells. A vaginal pH test may also be done.  TREATMENT  Bacterial vaginosis may be treated with antibiotic medicines. These may be given in the form of a pill or a vaginal cream. A second round of antibiotics may be prescribed if the condition comes back after treatment.  HOME CARE INSTRUCTIONS   Only take over-the-counter or prescription medicines as directed by your health care provider.  If antibiotic medicine was prescribed, take it as directed. Make sure you finish it even if you start to feel better.  Do  not have sex until treatment is completed.  Tell all sexual partners that you have a vaginal infection. They should see their health care provider and be treated if they have problems, such as a mild rash or itching.  Practice safe sex by using condoms and only having one sex partner. SEEK MEDICAL CARE IF:   Your symptoms are not improving after 3 days of treatment.  You have increased discharge or pain.  You have a fever. MAKE SURE YOU:   Understand these instructions.  Will watch your condition.  Will get help right away if you are not doing well or get worse. FOR MORE INFORMATION  Centers for Disease Control and Prevention, Division of STD Prevention: [employer]raiser[employer]raud[employer] American Sexual Health Association (ASHA): www.ashastd.org  Document Released: 06/01/2005 Document Revised: 03/22/2013 Document Reviewed: 01/11/2013 [facility] Patient Information 2014 [place].

## 2013-07-04 NOTE — Addendum Note (Signed)
Addended by: Su Grand A on: 07/04/2013 11:44 AM   Modules accepted: Orders

## 2013-07-04 NOTE — Progress Notes (Signed)
Dawn Porter 06/28/1990 [id number]   History:    23 y.o.  for annual gyn exam who had run out of her oral contraceptive pills. Although she stated she had had normal menstrual cycles until December where she had 2 periods in a month. Patient is interested in looking and other forms of contraception. Patient has completed the HPV vaccine series in the past. Patient was interested in an STD screen today.   Past medical history,surgical history, family history and social history were all reviewed and documented in the EPIC chart.  Gynecologic History Patient's last menstrual period was 06/15/2013. Contraception: OCP (estrogen/progesterone) Last Pap: 2012. Results were: normal Last mammogram: Not indicated. Results were: None indicated  Obstetric History OB History  Gravida Para Term Preterm AB SAB TAB Ectopic Multiple Living  0                  ROS: A ROS was performed and pertinent positives and negatives are included in the history.  GENERAL: No fevers or chills. HEENT: No change in vision, no earache, sore throat or sinus congestion. NECK: No pain or stiffness. CARDIOVASCULAR: No chest pain or pressure. No palpitations. PULMONARY: No shortness of breath, cough or wheeze. GASTROINTESTINAL: No abdominal pain, nausea, vomiting or diarrhea, melena or bright red blood per rectum. GENITOURINARY: No urinary frequency, urgency, hesitancy or dysuria. MUSCULOSKELETAL: No joint or muscle pain, no back pain, no recent trauma. DERMATOLOGIC: No rash, no itching, no lesions. ENDOCRINE: No polyuria, polydipsia, no heat or cold intolerance. No recent change in weight. HEMATOLOGICAL: No anemia or easy bruising or bleeding. NEUROLOGIC: No headache, seizures, numbness, tingling or weakness. PSYCHIATRIC: No depression, no loss of interest in normal activity or change in sleep pattern.     Exam: chaperone present  BP 110/76  Ht 5\' 3"  (1.6 m)  Wt 124 lb (56.246 kg)  BMI 21.97 kg/m2  LMP  06/15/2013  Body mass index is 21.97 kg/(m^2).  General appearance : Well developed well nourished female. No acute distress HEENT: Neck supple, trachea midline, no carotid bruits, no thyroidmegaly Lungs: Clear to auscultation, no rhonchi or wheezes, or rib retractions  Heart: Regular rate and rhythm, no murmurs or gallops Breast:Examined in sitting and supine position were symmetrical in appearance, no palpable masses or tenderness,  no skin retraction, no nipple inversion, no nipple discharge, no skin discoloration, no axillary or supraclavicular lymphadenopathy Abdomen: no palpable masses or tenderness, no rebound or guarding Extremities: no edema or skin discoloration or tenderness  Pelvic:  Bartholin, Urethra, Skene Glands: Within normal limits             Vagina: No gross lesions or discharge, clear white discharge  Cervix: No gross lesions or discharge  Uterus  anteverted, normal size, shape and consistency, non-tender and mobile  Adnexa  Without masses or tenderness  Anus and perineum  normal   Rectovaginal  normal sphincter tone without palpated masses or tenderness             Hemoccult not indicated   Wet prep:  GC and chlamydia culture pending  Assessment/Plan:  23 y.o. 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 The risks benefits and pros and cons have been outlined. Pap smear without HPV was done today in accordance to the new guidelines. GC and Chlamydia culture obtained results pending at time of this dictation.  The following labs were ordered: HIV, RPR, hepatitis B and C. along with CBC and urinalysis. Literature information was provided on the NuvaRing as well as the Nexplanon if she wants to change later this year when she returns back from [place]. Patient did receive a flu vaccine today. Wet prep demonstrated BV. She will be  prescribe Flagyl 500 mg BID for 7 days.  Note: This dictation was prepared with  Dragon/digital dictation along withSmart phrase technology. Any transcriptional errors that result from this process are unintentional.   Terrance Mass MD, 11:11 AM 07/04/2013

## 2013-07-05 LAB — URINALYSIS W MICROSCOPIC + REFLEX CULTURE
Bilirubin Urine: NEGATIVE
CASTS: NONE SEEN
CRYSTALS: NONE SEEN
GLUCOSE, UA: NEGATIVE mg/dL
HGB URINE DIPSTICK: NEGATIVE
Ketones, ur: NEGATIVE mg/dL
LEUKOCYTES UA: NEGATIVE
Nitrite: NEGATIVE
PH: 6.5 (ref 5.0–8.0)
Protein, ur: NEGATIVE mg/dL
Specific Gravity, Urine: 1.021 (ref 1.005–1.030)
Urobilinogen, UA: 0.2 mg/dL (ref 0.0–1.0)

## 2013-07-05 LAB — GC/CHLAMYDIA PROBE AMP
CT PROBE, AMP APTIMA: NEGATIVE
GC Probe RNA: NEGATIVE

## 2013-07-05 LAB — HEPATITIS C ANTIBODY: HCV Ab: NEGATIVE

## 2013-07-05 LAB — RPR

## 2013-07-05 LAB — HIV ANTIBODY (ROUTINE TESTING W REFLEX): HIV: NONREACTIVE

## 2013-07-05 LAB — HEPATITIS B SURFACE ANTIGEN: Hepatitis B Surface Ag: NEGATIVE

## 2013-07-17 ENCOUNTER — Telehealth: Payer: Self-pay

## 2013-07-17 ENCOUNTER — Other Ambulatory Visit: Payer: Self-pay | Admitting: Gynecology

## 2013-07-17 NOTE — Telephone Encounter (Signed)
Patient is in [place] and is c/o vaginal yeast infection. Said she is very familiar with symptoms and hopes you will give her Rx.

## 2013-07-17 NOTE — Telephone Encounter (Signed)
Please call a prescription for Diflucan 150 mg one by mouth

## 2013-07-18 MED ORDER — FLUCONAZOLE 150 MG PO TABS: 150.0000 mg | ORAL_TABLET | Freq: Once | ORAL | Status: DC

## 2013-07-21 ENCOUNTER — Other Ambulatory Visit: Payer: Self-pay | Admitting: Gynecology

## 2013-07-21 MED ORDER — ETONOGESTREL-ETHINYL ESTRADIOL 0.12-0.015 MG/24HR VA RING: 1.0000 | VAGINAL_RING | VAGINAL | Status: DC

## 2013-07-21 NOTE — Addendum Note (Signed)
Addended by: Thamas Jaegers on: 07/21/2013 11:00 AM   Modules accepted: Orders

## 2013-07-21 NOTE — Telephone Encounter (Signed)
Pt also accidentally threw away her nuvaring requesting a new sent to pharmacy. 1 sent.

## 2013-07-21 NOTE — Telephone Encounter (Signed)
Pt was given #1 on 07/17/13 still c/o yeast symptoms. Please advise

## 2013-08-18 ENCOUNTER — Telehealth: Payer: Self-pay | Admitting: *Deleted

## 2013-08-18 MED ORDER — METRONIDAZOLE 0.75 % VA GEL: 1.0000 | Freq: Every day | VAGINAL | Status: DC

## 2013-08-18 MED ORDER — FLUCONAZOLE 150 MG PO TABS: ORAL_TABLET | ORAL | Status: DC

## 2013-08-18 NOTE — Telephone Encounter (Signed)
Pt informed, rx sent 

## 2013-08-18 NOTE — Telephone Encounter (Signed)
Pt is in [place] c/o yeast infection (diflucan) and BV infection (metrogel) requesting medication for both. Itching white discharge and odor. Pt is fully aware that OV required for this, but she is in [place]. Pt said she is having a hard time finding a provider that accepts her insurance. Please advise

## 2013-08-18 NOTE — Telephone Encounter (Signed)
Go ahead and call a prescription of each . If she has no improvement she needs to go to an urgent medical care or when she returns back here to [place].

## 2014-09-06 ENCOUNTER — Encounter: Payer: Self-pay | Admitting: Gynecology

## 2014-09-06 ENCOUNTER — Ambulatory Visit (INDEPENDENT_AMBULATORY_CARE_PROVIDER_SITE_OTHER): Payer: BLUE CROSS/BLUE SHIELD | Admitting: Gynecology

## 2014-09-06 VITALS — BP 112/76 | Ht 63.0 in | Wt 122.0 lb

## 2014-09-06 DIAGNOSIS — Z113 Encounter for screening for infections with a predominantly sexual mode of transmission: Secondary | ICD-10-CM | POA: Diagnosis not present

## 2014-09-06 DIAGNOSIS — Z01419 Encounter for gynecological examination (general) (routine) without abnormal findings: Secondary | ICD-10-CM | POA: Diagnosis not present

## 2014-09-06 DIAGNOSIS — L700 Acne vulgaris: Secondary | ICD-10-CM | POA: Diagnosis not present

## 2014-09-06 LAB — CBC WITH DIFFERENTIAL/PLATELET
Basophils Absolute: 0 10*3/uL (ref 0.0–0.1)
Basophils Relative: 0 % (ref 0–1)
EOS ABS: 0.5 10*3/uL (ref 0.0–0.7)
Eosinophils Relative: 9 % — ABNORMAL HIGH (ref 0–5)
HEMATOCRIT: 42.8 % (ref 36.0–46.0)
Hemoglobin: 14.6 g/dL (ref 12.0–15.0)
LYMPHS ABS: 2.3 10*3/uL (ref 0.7–4.0)
LYMPHS PCT: 42 % (ref 12–46)
MCH: 29.6 pg (ref 26.0–34.0)
MCHC: 34.1 g/dL (ref 30.0–36.0)
MCV: 86.8 fL (ref 78.0–100.0)
MONOS PCT: 9 % (ref 3–12)
MPV: 9.5 fL (ref 8.6–12.4)
Monocytes Absolute: 0.5 10*3/uL (ref 0.1–1.0)
NEUTROS ABS: 2.2 10*3/uL (ref 1.7–7.7)
NEUTROS PCT: 40 % — AB (ref 43–77)
Platelets: 291 10*3/uL (ref 150–400)
RBC: 4.93 MIL/uL (ref 3.87–5.11)
RDW: 13.3 % (ref 11.5–15.5)
WBC: 5.5 10*3/uL (ref 4.0–10.5)

## 2014-09-06 LAB — COMPREHENSIVE METABOLIC PANEL
ALK PHOS: 52 U/L (ref 39–117)
ALT: 25 U/L (ref 0–35)
AST: 23 U/L (ref 0–37)
Albumin: 4.3 g/dL (ref 3.5–5.2)
BILIRUBIN TOTAL: 0.3 mg/dL (ref 0.2–1.2)
BUN: 11 mg/dL (ref 6–23)
CO2: 26 mEq/L (ref 19–32)
Calcium: 9.8 mg/dL (ref 8.4–10.5)
Chloride: 102 mEq/L (ref 96–112)
Creat: 0.67 mg/dL (ref 0.50–1.10)
GLUCOSE: 71 mg/dL (ref 70–99)
Potassium: 4.4 mEq/L (ref 3.5–5.3)
SODIUM: 139 meq/L (ref 135–145)
TOTAL PROTEIN: 6.7 g/dL (ref 6.0–8.3)

## 2014-09-06 LAB — CHOLESTEROL, TOTAL: Cholesterol: 125 mg/dL (ref 0–200)

## 2014-09-06 MED ORDER — DROSPIRENONE-ETHINYL ESTRADIOL 3-0.03 MG PO TABS: 1.0000 | ORAL_TABLET | Freq: Every day | ORAL | Status: DC

## 2014-09-06 NOTE — Patient Instructions (Signed)

## 2014-09-06 NOTE — Progress Notes (Signed)
Dawn Porter 1991-04-29 [id number]   History:    24 y.o.  for annual gyn exam with the only complaint being of acne. Patient is having normal menstrual cycles. Currently on no oral contraceptive pill. Has not been sexually active in several months but would like to have an STD screen. Reconsidering going back on oral contraceptive pills. Patient has completed the HPV vaccine series in the past. No past history of any abnormal Pap smear. Patient was drawn family history of thyroid disease.  Past medical history,surgical history, family history and social history were all reviewed and documented in the EPIC chart.  Gynecologic History Patient's last menstrual period was 08/07/2014. Contraception: none Last Pap: 2015. Results were: normal Last mammogram: Not indicated. Results were: Not indicated  Obstetric History OB History  Gravida Para Term Preterm AB SAB TAB Ectopic Multiple Living  0                  ROS: A ROS was performed and pertinent positives and negatives are included in the history.  GENERAL: No fevers or chills. HEENT: No change in vision, no earache, sore throat or sinus congestion. NECK: No pain or stiffness. CARDIOVASCULAR: No chest pain or pressure. No palpitations. PULMONARY: No shortness of breath, cough or wheeze. GASTROINTESTINAL: No abdominal pain, nausea, vomiting or diarrhea, melena or bright red blood per rectum. GENITOURINARY: No urinary frequency, urgency, hesitancy or dysuria. MUSCULOSKELETAL: No joint or muscle pain, no back pain, no recent trauma. DERMATOLOGIC: No rash, no itching, no lesions. ENDOCRINE: No polyuria, polydipsia, no heat or cold intolerance. No recent change in weight. HEMATOLOGICAL: No anemia or easy bruising or bleeding. NEUROLOGIC: No headache, seizures, numbness, tingling or weakness. PSYCHIATRIC: No depression, no loss of interest in normal activity or change in sleep pattern.     Exam: chaperone present  BP 112/76 mmHg  Ht 5\' 3"   (1.6 m)  Wt 122 lb (55.339 kg)  BMI 21.62 kg/m2  LMP 08/07/2014  Body mass index is 21.62 kg/(m^2).  General appearance : Well developed well nourished female. No acute distress HEENT: Eyes: no retinal hemorrhage or exudates,  Neck supple, trachea midline, no carotid bruits, no thyroidmegaly Lungs: Clear to auscultation, no rhonchi or wheezes, or rib retractions  Heart: Regular rate and rhythm, no murmurs or gallops Breast:Examined in sitting and supine position were symmetrical in appearance, no palpable masses or tenderness,  no skin retraction, no nipple inversion, no nipple discharge, no skin discoloration, no axillary or supraclavicular lymphadenopathy Abdomen: no palpable masses or tenderness, no rebound or guarding Extremities: no edema or skin discoloration or tenderness  Pelvic:  Bartholin, Urethra, Skene Glands: Within normal limits             Vagina: No gross lesions or discharge  Cervix: No gross lesions or discharge  Uterus  anteverted, normal size, shape and consistency, non-tender and mobile  Adnexa  Without masses or tenderness  Anus and perineum  normal   Rectovaginal  normal sphincter tone without palpated masses or tenderness             Hemoccult not indicated     Assessment/Plan:  24 y.o. female for annual exam with acne and wanted to start oral contraceptive pill. She is going to be started on generic Yaz. Risks benefits and pros and cons of oral contraceptive pills were discussed. We discussed potential risks of blood clots and pulmonary embolism. 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 Patient is  going [place] for 6 [place]. I have asked her not to start the oral contraceptive pill until next month when she is already overseas. The following labs ordered today for screening: CBC, screening cholesterol, comprehensive at a bowling panel, TSH, and urinalysis. As part of the STD screening the following was ordered: GC and Chlamydia culture, HIV,  RPR, hepatitis B and C.   Terrance Mass MD, 9:09 AM 09/06/2014

## 2014-09-07 LAB — URINALYSIS W MICROSCOPIC + REFLEX CULTURE
BACTERIA UA: NONE SEEN
Bilirubin Urine: NEGATIVE
CASTS: NONE SEEN
Crystals: NONE SEEN
GLUCOSE, UA: NEGATIVE mg/dL
HGB URINE DIPSTICK: NEGATIVE
KETONES UR: NEGATIVE mg/dL
LEUKOCYTES UA: NEGATIVE
Nitrite: NEGATIVE
PH: 6 (ref 5.0–8.0)
PROTEIN: NEGATIVE mg/dL
Specific Gravity, Urine: 1.005 (ref 1.005–1.030)
Squamous Epithelial / LPF: NONE SEEN
Urobilinogen, UA: 0.2 mg/dL (ref 0.0–1.0)

## 2014-09-07 LAB — GC/CHLAMYDIA PROBE AMP
CT Probe RNA: NEGATIVE
GC Probe RNA: NEGATIVE

## 2014-09-07 LAB — HIV ANTIBODY (ROUTINE TESTING W REFLEX): HIV: NONREACTIVE

## 2014-09-07 LAB — TSH: TSH: 2.776 u[IU]/mL (ref 0.350–4.500)

## 2014-09-07 LAB — HEPATITIS C ANTIBODY: HCV Ab: NEGATIVE

## 2014-09-07 LAB — HEPATITIS B SURFACE ANTIGEN: HEP B S AG: NEGATIVE

## 2014-09-07 LAB — RPR

## 2014-09-12 ENCOUNTER — Encounter: Payer: Self-pay | Admitting: Gynecology

## 2014-10-09 ENCOUNTER — Telehealth: Payer: Self-pay | Admitting: *Deleted

## 2014-10-09 NOTE — Telephone Encounter (Signed)
Pt left message in triage asking when should she start her birth control pills due to going [place]. Per note 09/06/14 have asked her not to start the oral contraceptive pill until she is already overseas. I was unable to relay to patient as her voicemail was not set up. I will try back later.

## 2015-01-15 IMAGING — CR DG CHEST 2V
2 series · 2 of 2 positions shown · non-contrast
Comparison: None.

CLINICAL DATA: 21-year-old female status post fall from horse.

CHEST - 2 VIEW

[PA]
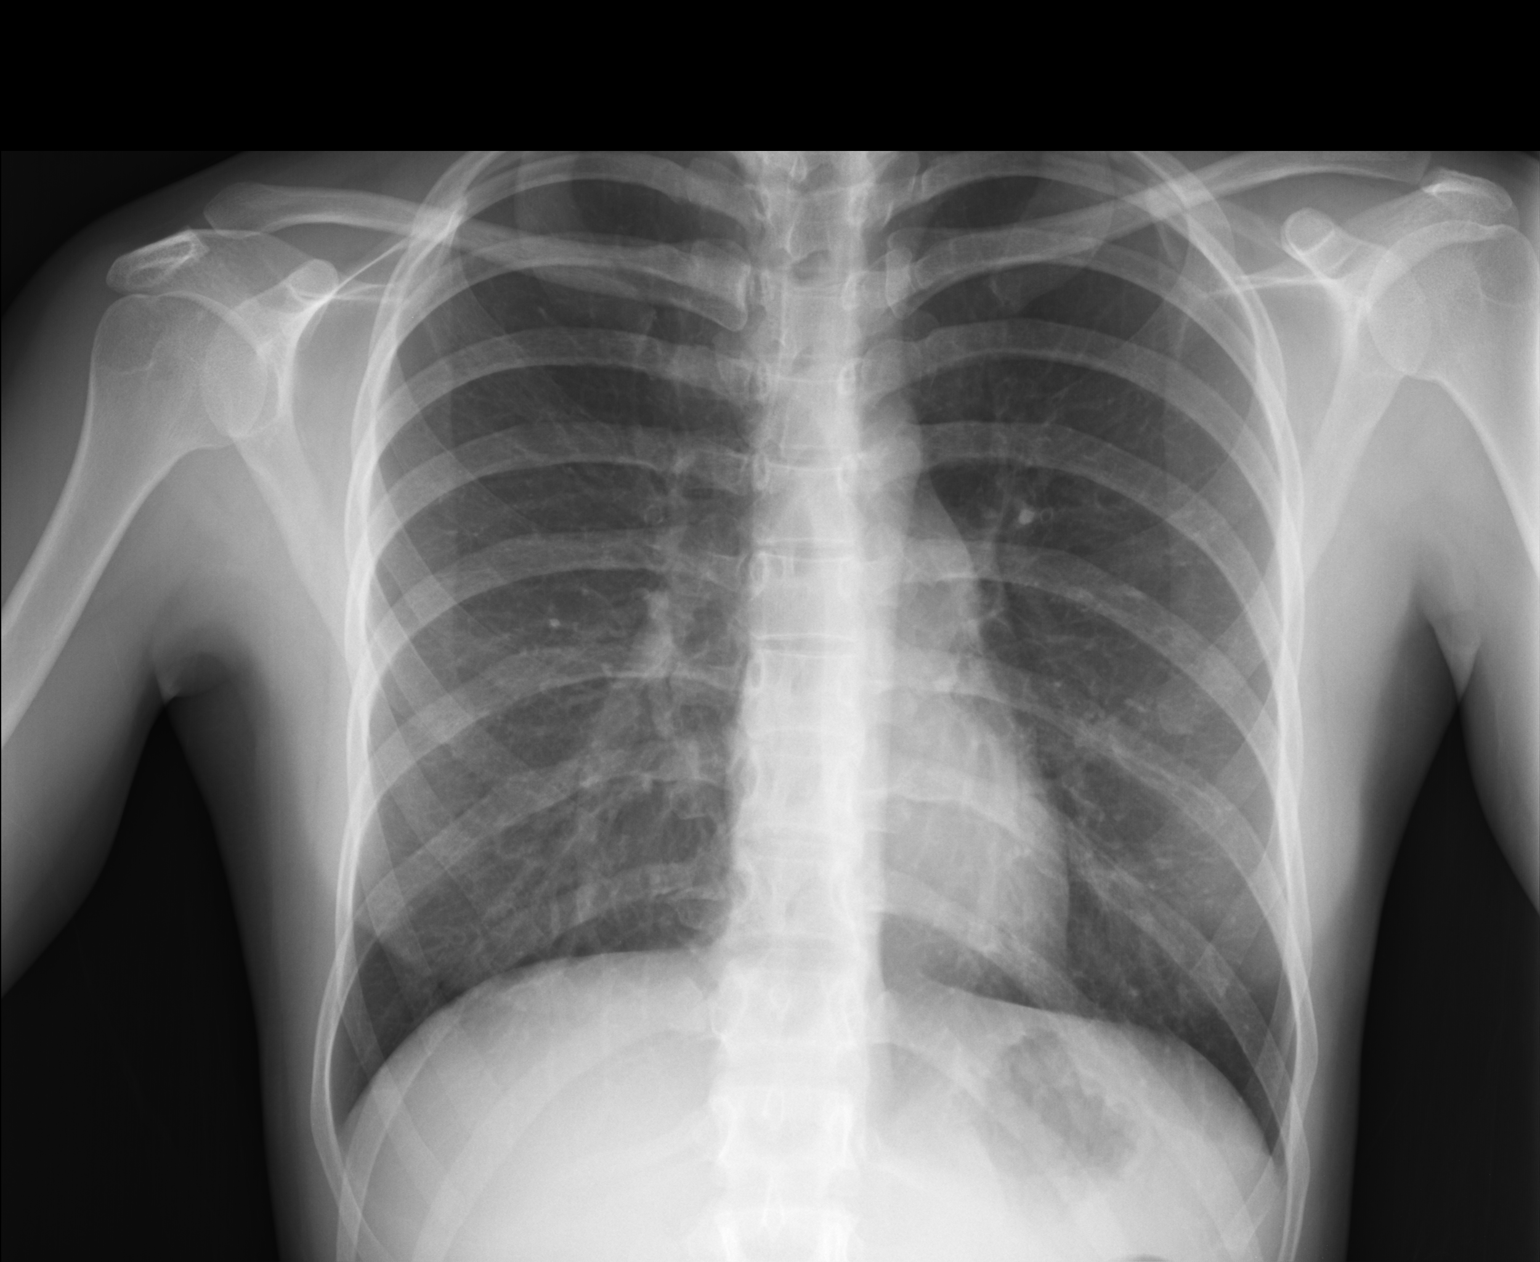

[lateral]
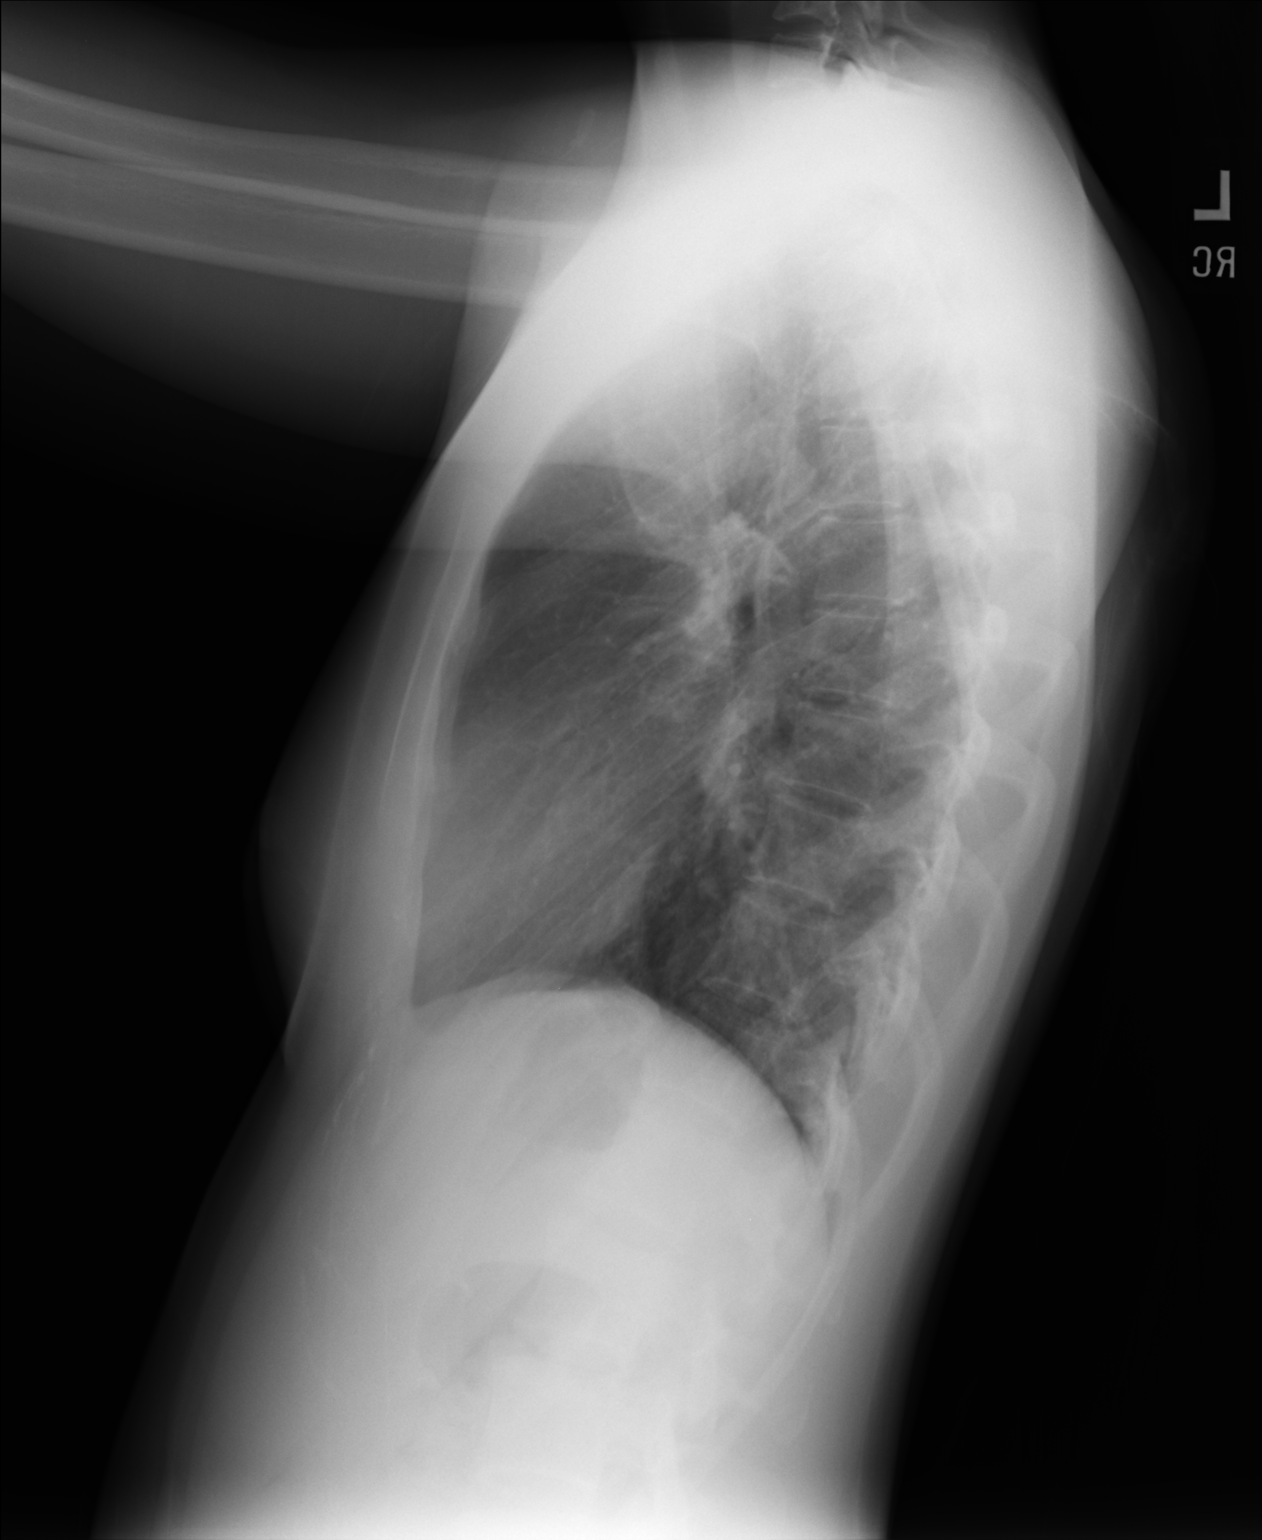

[2 of 2 positions shown; findings below may reference images not displayed]

FINDINGS: Lung volumes are within normal limits.  Cardiac size and
mediastinal contours are within normal limits.  Visualized tracheal
air column is within normal limits.  No pneumothorax, pulmonary
edema, pleural effusion or pulmonary contusion. No acute displaced
rib fracture identified.  No acute osseous abnormality identified.
IMPRESSION: No acute cardiopulmonary abnormality or acute traumatic injury
identified.

## 2015-02-22 ENCOUNTER — Telehealth: Payer: Self-pay | Admitting: Gynecology

## 2015-02-22 NOTE — Telephone Encounter (Signed)
02/22/15-I advised pt today that her insurance covers the Mirena for contraception at 100%, no copay. She will call next cycle to schedule with JF/wl

## 2015-03-29 ENCOUNTER — Ambulatory Visit (INDEPENDENT_AMBULATORY_CARE_PROVIDER_SITE_OTHER): Payer: BLUE CROSS/BLUE SHIELD | Admitting: Women's Health

## 2015-03-29 ENCOUNTER — Encounter: Payer: Self-pay | Admitting: Women's Health

## 2015-03-29 VITALS — BP 110/80 | Ht 63.0 in | Wt 128.0 lb

## 2015-03-29 DIAGNOSIS — Z113 Encounter for screening for infections with a predominantly sexual mode of transmission: Secondary | ICD-10-CM | POA: Diagnosis not present

## 2015-03-29 DIAGNOSIS — N912 Amenorrhea, unspecified: Secondary | ICD-10-CM | POA: Diagnosis not present

## 2015-03-29 LAB — PREGNANCY, URINE: PREG TEST UR: NEGATIVE

## 2015-03-29 MED ORDER — MEDROXYPROGESTERONE ACETATE 10 MG PO TABS: 10.0000 mg | ORAL_TABLET | Freq: Every day | ORAL | Status: DC

## 2015-03-29 NOTE — Patient Instructions (Signed)
Secondary Amenorrhea Secondary amenorrhea is the stopping of menstrual flow for 3-6 months in a female who has previously had periods. There are many possible causes. Most of these causes are not serious. Usually, treating the underlying problem causing the loss of menses will return your periods to normal. CAUSES  Some common and uncommon causes of not menstruating include:  Malnutrition.  Low blood sugar (hypoglycemia).  Polycystic ovary disease.  Stress or fear.  Breastfeeding.  Hormone imbalance.  Ovarian failure.  Medicines.  Extreme obesity.  Cystic fibrosis.  Low body weight or drastic weight reduction from any cause.  Early menopause.  Removal of ovaries or uterus.  Contraceptives.  Illness.  Long-term (chronic) illnesses.  Cushing syndrome.  Thyroid problems.  Birth control pills, patches, or vaginal rings for birth control. RISK FACTORS You may be at greater risk of secondary amenorrhea if:  You have a family history of this condition.  You have an eating disorder.  You do athletic training. DIAGNOSIS  A diagnosis is made by your health care provider taking a medical history and doing a physical exam. This will include a pelvic exam to check for problems with your reproductive organs. Pregnancy must be ruled out. Often, numerous blood tests are done to measure different hormones in the body. Urine testing may be done. Specialized exams (ultrasound, CT scan, MRI, or hysteroscopy) may have to be done as well as measuring the body mass index (BMI). TREATMENT  Treatment depends on the cause of the amenorrhea. If an eating disorder is present, this can be treated with an adequate diet and therapy. Chronic illnesses may improve with treatment of the illness. Amenorrhea may be corrected with medicines, lifestyle changes, or surgery. If the amenorrhea cannot be corrected, it is sometimes possible to create a false menstruation with medicines. HOME CARE  INSTRUCTIONS  Maintain a healthy diet.  Manage weight problems.  Exercise regularly but not excessively.  Get adequate sleep.  Manage stress.  Be aware of changes in your menstrual cycle. Keep a record of when your periods occur. Note the date your period starts, how long it lasts, and any problems. SEEK MEDICAL CARE IF: Your symptoms do not get better with treatment.   This information is not intended to replace advice given to you by your health care provider. Make sure you discuss any questions you have with your health care provider.   Document Released: 07/13/2006 Document Revised: 06/22/2014 Document Reviewed: 11/17/2012 Elsevier Interactive Patient Education [employer].

## 2015-03-29 NOTE — Progress Notes (Signed)
Patient ID: MIRREN GEST, female   DOB: March 17, 1991, 24 y.o.   MRN: [id number] Presents with complaint of amenorrhea and negative UPT. Had unprotected intercourse several weeks ago took morning-after pill and has not had a cycle. LMP September 3. Normal cycle. Denies abdominal pain, discharge, urinary symptoms or fever. Reports anxiety, requesting medication to make cycle start. Also wanting to try Mirena IUD, requesting antianxiety medication to take day of placement. Has had occasional missed cycles in the past..  Exam: Appears well. UPT negative. External genitalia within normal limits, speculum exam scant discharge, no odor, GC/Chlamydia culture taken and is pending. Bimanual no CMT or adnexal tenderness.  STD screen Amenorrhea  Plan: Qualitative hCG is negative Provera 10 for 5 days. Started to call if no cycle after Provera. GC/Chlamydia culture pending, HIV, hep B, C, RPR. Aware of importance of condoms if sexually active. Mirena IUD information given and reviewed slight risk for infection, perforation or hemorrhage. Valium 5 mg by mouth prior to IUD, reviewed importance of having someone drive her to appointment with Dr. Toney Rakes to have IUD placed with cycle. Prescription for Valium 5 mg #2 tablets only.

## 2015-03-30 LAB — HCG, SERUM, QUALITATIVE: Preg, Serum: NEGATIVE

## 2015-03-30 LAB — RPR

## 2015-03-30 LAB — HEPATITIS C ANTIBODY: HCV Ab: NEGATIVE

## 2015-03-30 LAB — GC/CHLAMYDIA PROBE AMP
CT Probe RNA: NEGATIVE
GC Probe RNA: NEGATIVE

## 2015-03-30 LAB — HIV ANTIBODY (ROUTINE TESTING W REFLEX): HIV 1&2 Ab, 4th Generation: NONREACTIVE

## 2015-03-30 LAB — HEPATITIS B SURFACE ANTIGEN: Hepatitis B Surface Ag: NEGATIVE

## 2015-03-31 ENCOUNTER — Encounter: Payer: Self-pay | Admitting: Women's Health

## 2015-04-08 ENCOUNTER — Telehealth: Payer: Self-pay | Admitting: *Deleted

## 2015-04-08 NOTE — Telephone Encounter (Signed)
Pt called and left message in traige to call her, I called and phone went to voicemail, I left message for pt to call me back

## 2015-04-26 ENCOUNTER — Other Ambulatory Visit: Payer: Self-pay | Admitting: Emergency Medicine

## 2015-04-26 ENCOUNTER — Ambulatory Visit (INDEPENDENT_AMBULATORY_CARE_PROVIDER_SITE_OTHER): Payer: BLUE CROSS/BLUE SHIELD | Admitting: Emergency Medicine

## 2015-04-26 VITALS — BP 100/66 | HR 98 | Temp 97.7°F | Resp 16 | Ht 63.0 in | Wt 123.0 lb

## 2015-04-26 DIAGNOSIS — A09 Infectious gastroenteritis and colitis, unspecified: Secondary | ICD-10-CM | POA: Diagnosis not present

## 2015-04-26 DIAGNOSIS — R197 Diarrhea, unspecified: Secondary | ICD-10-CM

## 2015-04-26 LAB — POCT CBC
GRANULOCYTE PERCENT: 62.8 % (ref 37–80)
HEMATOCRIT: 42.1 % (ref 37.7–47.9)
HEMOGLOBIN: 14.8 g/dL (ref 12.2–16.2)
Lymph, poc: 1.8 (ref 0.6–3.4)
MCH: 29.3 pg (ref 27–31.2)
MCHC: 35.3 g/dL (ref 31.8–35.4)
MCV: 83.1 fL (ref 80–97)
MID (CBC): 0.4 (ref 0–0.9)
MPV: 7.1 fL (ref 0–99.8)
POC GRANULOCYTE: 3.7 (ref 2–6.9)
POC LYMPH PERCENT: 30.6 %L (ref 10–50)
POC MID %: 6.6 % (ref 0–12)
Platelet Count, POC: 283 10*3/uL (ref 142–424)
RBC: 5.06 M/uL (ref 4.04–5.48)
RDW, POC: 12.8 %
WBC: 5.9 10*3/uL (ref 4.6–10.2)

## 2015-04-26 LAB — COMPREHENSIVE METABOLIC PANEL
ALT: 51 U/L — ABNORMAL HIGH (ref 6–29)
AST: 43 U/L — ABNORMAL HIGH (ref 10–30)
Albumin: 4.4 g/dL (ref 3.6–5.1)
Alkaline Phosphatase: 61 U/L (ref 33–115)
BUN: 8 mg/dL (ref 7–25)
CALCIUM: 9.4 mg/dL (ref 8.6–10.2)
CHLORIDE: 103 mmol/L (ref 98–110)
CO2: 25 mmol/L (ref 20–31)
Creat: 0.66 mg/dL (ref 0.50–1.10)
GLUCOSE: 71 mg/dL (ref 65–99)
POTASSIUM: 4.2 mmol/L (ref 3.5–5.3)
Sodium: 138 mmol/L (ref 135–146)
Total Bilirubin: 0.6 mg/dL (ref 0.2–1.2)
Total Protein: 6.9 g/dL (ref 6.1–8.1)

## 2015-04-26 MED ORDER — CIPROFLOXACIN HCL 500 MG PO TABS: 500.0000 mg | ORAL_TABLET | Freq: Two times a day (BID) | ORAL | Status: DC

## 2015-04-26 MED ORDER — DIPHENOXYLATE-ATROPINE 2.5-0.025 MG PO TABS: 2.0000 | ORAL_TABLET | Freq: Four times a day (QID) | ORAL | Status: DC | PRN
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
## 2015-04-26 NOTE — Patient Instructions (Signed)

## 2015-04-27 ENCOUNTER — Encounter: Payer: Self-pay | Admitting: Emergency Medicine

## 2015-04-27 LAB — C. DIFFICILE GDH AND TOXIN A/B
C. difficile GDH: NOT DETECTED
C. difficile Toxin A/B: NOT DETECTED

## 2015-04-29 LAB — OVA AND PARASITE EXAMINATION
OP: NONE SEEN
OP: NONE SEEN
OP: NONE SEEN

## 2015-04-30 LAB — GASTROINTESTINAL PATHOGEN PANEL PCR
C. difficile Tox A/B, PCR: NOT DETECTED
CAMPYLOBACTER, PCR: NOT DETECTED
Cryptosporidium, PCR: NOT DETECTED
E coli (ETEC) LT/ST PCR: NOT DETECTED
E coli (STEC) stx1/stx2, PCR: NOT DETECTED
E coli 0157, PCR: NOT DETECTED
Giardia lamblia, PCR: NOT DETECTED
Norovirus, PCR: NOT DETECTED
ROTAVIRUS, PCR: NOT DETECTED
SALMONELLA, PCR: NOT DETECTED
SHIGELLA, PCR: NOT DETECTED

## 2015-05-04 ENCOUNTER — Encounter: Payer: Self-pay | Admitting: Emergency Medicine

## 2015-05-13 ENCOUNTER — Telehealth: Payer: Self-pay | Admitting: *Deleted

## 2015-05-13 MED ORDER — DIAZEPAM 5 MG PO TABS: ORAL_TABLET | ORAL | Status: DC

## 2015-05-13 NOTE — Telephone Encounter (Signed)
Okay, please call in Valium 5mg   #2 have her take 1 prior to office visit have someone drive her.

## 2015-05-13 NOTE — Telephone Encounter (Signed)
Pt scheduled to have Mirena IUD placed on 05/15/15, was given Rx for Valium 5 mg #2 tablets only, pt lost Rx. Asked if new Rx could be called in. Please advise

## 2015-05-13 NOTE — Telephone Encounter (Signed)
Rx called in 

## 2015-05-14 ENCOUNTER — Ambulatory Visit: Payer: BLUE CROSS/BLUE SHIELD | Admitting: Gynecology

## 2015-05-15 ENCOUNTER — Ambulatory Visit (INDEPENDENT_AMBULATORY_CARE_PROVIDER_SITE_OTHER): Payer: BLUE CROSS/BLUE SHIELD | Admitting: Gynecology

## 2015-05-15 ENCOUNTER — Encounter: Payer: Self-pay | Admitting: Gynecology

## 2015-05-15 VITALS — BP 106/60

## 2015-05-15 DIAGNOSIS — Z975 Presence of (intrauterine) contraceptive device: Secondary | ICD-10-CM | POA: Insufficient documentation

## 2015-05-15 DIAGNOSIS — Z23 Encounter for immunization: Secondary | ICD-10-CM | POA: Diagnosis not present

## 2015-05-15 DIAGNOSIS — Z30430 Encounter for insertion of intrauterine contraceptive device: Secondary | ICD-10-CM | POA: Diagnosis not present

## 2015-05-15 NOTE — Patient Instructions (Signed)

## 2015-05-15 NOTE — Progress Notes (Signed)
   24 year old who presented to the office today for placement of Mirena IUD for contraception and cycle control. Patient had been seen in the office in March of this year and had a full STD screening which was negative. Patient previously been provided with literature information on the Mirena IUD. Patient currently menstruating. Patient requesting flu vaccine today. Pap smear normal 2015.                                                                    IUD procedure note       Patient presented to the office today for placement of Mirena IUD. The patient had previously been provided with literature information on this method of contraception. The risks benefits and pros and cons were discussed and all her questions were answered. She is fully aware that this form of contraception is 99% effective and is good for 5 years.  Pelvic exam: Bartholin urethra Skene glands: Within normal limits Vagina: No lesions or discharge Cervix: No lesions or discharge Uterus: Anteverted position Adnexa: No masses or tenderness Rectal exam: Not done  The cervix was cleansed with Betadine solution. A single-tooth tenaculum was placed on the anterior cervical lip. The uterus sounded to 6-1/2 centimeter. The IUD was shown to the patient and inserted in a sterile fashion. The IUD string was trimmed. The single-tooth tenaculum was removed. Patient was instructed to return back to the office in one month for follow up.       Mirena IUD placed 05/15/2015 good for 5 years. Lot number [id number]

## 2015-05-15 NOTE — Addendum Note (Signed)
Addended by: Alen Blew on: 05/15/2015 05:18 PM   Modules accepted: Orders

## 2015-05-16 ENCOUNTER — Encounter: Payer: Self-pay | Admitting: Gynecology

## 2015-05-17 ENCOUNTER — Other Ambulatory Visit: Payer: Self-pay

## 2015-05-17 ENCOUNTER — Ambulatory Visit (INDEPENDENT_AMBULATORY_CARE_PROVIDER_SITE_OTHER): Payer: BLUE CROSS/BLUE SHIELD | Admitting: Internal Medicine

## 2015-05-17 ENCOUNTER — Encounter: Payer: Self-pay | Admitting: Internal Medicine

## 2015-05-17 ENCOUNTER — Encounter: Payer: Self-pay | Admitting: Gynecology

## 2015-05-17 ENCOUNTER — Other Ambulatory Visit (INDEPENDENT_AMBULATORY_CARE_PROVIDER_SITE_OTHER): Payer: BLUE CROSS/BLUE SHIELD

## 2015-05-17 VITALS — BP 90/60 | HR 95 | Temp 98.4°F | Resp 16 | Ht 63.0 in | Wt 123.4 lb

## 2015-05-17 DIAGNOSIS — K319 Disease of stomach and duodenum, unspecified: Secondary | ICD-10-CM | POA: Diagnosis not present

## 2015-05-17 DIAGNOSIS — R5383 Other fatigue: Secondary | ICD-10-CM | POA: Insufficient documentation

## 2015-05-17 DIAGNOSIS — R55 Syncope and collapse: Secondary | ICD-10-CM | POA: Diagnosis not present

## 2015-05-17 LAB — COMPREHENSIVE METABOLIC PANEL
ALK PHOS: 65 U/L (ref 39–117)
ALT: 12 U/L (ref 0–35)
AST: 14 U/L (ref 0–37)
Albumin: 4.6 g/dL (ref 3.5–5.2)
BUN: 14 mg/dL (ref 6–23)
CHLORIDE: 103 meq/L (ref 96–112)
CO2: 28 meq/L (ref 19–32)
Calcium: 10 mg/dL (ref 8.4–10.5)
Creatinine, Ser: 0.69 mg/dL (ref 0.40–1.20)
GFR: 108.76 mL/min (ref >60.00)
GLUCOSE: 89 mg/dL (ref 70–99)
POTASSIUM: 3.9 meq/L (ref 3.5–5.1)
SODIUM: 138 meq/L (ref 135–145)
TOTAL PROTEIN: 8 g/dL (ref 6.0–8.3)
Total Bilirubin: 0.4 mg/dL (ref 0.2–1.2)

## 2015-05-17 LAB — FOLATE: Folate: 21.2 ng/mL (ref >5.9)

## 2015-05-17 LAB — T4, FREE: FREE T4: 1.03 ng/dL (ref 0.60–1.60)

## 2015-05-17 LAB — TSH: TSH: 3.23 u[IU]/mL (ref 0.35–4.50)

## 2015-05-17 LAB — VITAMIN B12: Vitamin B-12: 895 pg/mL (ref 211–911)

## 2015-05-17 NOTE — Progress Notes (Signed)
Pre visit review using our clinic review tool, if applicable. No additional management support is needed unless otherwise documented below in the visit note. 

## 2015-05-17 NOTE — Assessment & Plan Note (Signed)
She possible has some post-viral symptoms. She also has several risk factors for IBS. Given her other more acute symptoms were not able to adequately address. Checking TSH and free T4 given extensive family history of thyroid problems although some of her symptoms are underactive and some are overactive thyroid.

## 2015-05-17 NOTE — Progress Notes (Signed)
   Subjective:    Patient ID: Dawn Porter, female    DOB: 1990/10/05, 24 y.o.   MRN: [id number]  HPI The patient is a new 24 YO female coming in for several concerns. She has been passing out several times in the last month. She has had some mental health medication changes. Right around the time they had her off a medicine that did not work she got 2 weeks of massive diarrhea which was mostly water. No blood in the diarrhea. It resolved on its own. She was seen at urgent care with workup that was unrevealing for parasite, c dif. She did have some mild liver elevation at that time. Since that time her bowels are back to normal but she is having some numbness or strange sensation on her left stomach. Occasional stabbing pains.The syncope has happened since then and she gets pre-syncope symptoms. She is able to sit down or stop what she is doing but usually passes out. No history of sudden death in the family and no syncope in the family. Denies weight change, fevers, chills. She is eating 3 meals per day and not drinking much fluids.  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   PMH, [facility], social history reviewed and updated.    Review of Systems  Constitutional: Positive for activity change and fatigue. Negative for fever, chills, appetite change and unexpected weight change.  HENT: Negative.   Respiratory: Negative for cough, chest tightness, shortness of breath and wheezing.   Gastrointestinal: Positive for abdominal pain. Negative for nausea, vomiting, diarrhea, constipation and abdominal distention.  Musculoskeletal: Negative.   Skin: Negative.   Neurological: Positive for dizziness, syncope and light-headedness. Negative for tremors, seizures, facial asymmetry, speech difficulty, weakness and numbness.  Psychiatric/Behavioral: Positive for sleep disturbance, decreased  concentration and agitation. Negative for dysphoric mood.      Objective:   Physical Exam  Constitutional: She is oriented to person, place, and time. She appears well-developed and well-nourished.  HENT:  Head: Normocephalic and atraumatic.  Eyes: EOM are normal.  Neck: Normal range of motion.  Cardiovascular: Normal rate and regular rhythm.   Pulmonary/Chest: Effort normal and breath sounds normal. No respiratory distress. She has no wheezes. She has no rales.  Abdominal: Soft. Bowel sounds are normal. She exhibits no distension. There is no tenderness. There is no rebound.  Musculoskeletal: She exhibits no edema.  Neurological: She is alert and oriented to person, place, and time. Coordination normal.  Skin: Skin is warm and dry.  Psychiatric: She has a normal mood and affect.   Filed Vitals:   05/17/15 1308  BP: 90/60  Pulse: 95  Temp: 98.4 F (36.9 C)  TempSrc: Oral  Resp: 16  Height: 5\' 3"  (1.6 m)  Weight: 123 lb 6.4 oz (55.974 kg)  SpO2: 99%   EKG: rate 72, sinus, normal axis, normal intervals, no st or t wave changes.     Assessment & Plan:

## 2015-05-17 NOTE — Patient Instructions (Signed)
The EKG of your heart today was normal as expected. We are also checking some different blood work today to look for causes. We are checking the thyroid as well.   Try to increase your salt intake to help encourage you to drink more fluids. We suspect that low blood pressure could be part of the reason for the passing out episodes.   If you keep passing out please call us back and we may have you see a cardiologist (heart specialist).

## 2015-05-17 NOTE — Assessment & Plan Note (Signed)
She has had 3 syncopal episodes and possibly related to her state of malnutrition after 2 weeks of diarrhea with poor PO intake. She is now eating normally. No symptoms on standing in the office. EKG without acute findings in the office today. Advised her to eat more salt and work on liquid intake increasing. Checking labs today to check for electrolyte deficiency or vitamin deficiency, thyroid problem. If she has recurrence would send to cardiology for evaluation.

## 2015-05-17 NOTE — Assessment & Plan Note (Signed)
Checking thyroid, liver, kidneys. Checking hep B surface AB for proof of immunity. She did have slight elevation of liver numbers during diarrheal illness (wonder if it was hep a?). If it was hepatitis A then she could be having significant fatigue for several months in typical course.

## 2015-05-17 NOTE — Addendum Note (Signed)
Addended by: Pricilla Holm A on: 05/17/2015 03:28 PM   Modules accepted: Orders

## 2015-05-18 LAB — HEPATITIS A ANTIBODY, IGM: HEP A IGM: NONREACTIVE

## 2015-05-18 LAB — HEPATITIS B SURFACE ANTIBODY,QUALITATIVE

## 2015-05-20 ENCOUNTER — Encounter: Payer: Self-pay | Admitting: Gynecology

## 2015-05-20 NOTE — Telephone Encounter (Signed)
Patient had IUD placed 05/15/15. She emailed you last week about having sensation when she goes to urinate and it feels like she is going to pass IUD.  You had reassured her that sensation was normal and should subside.

## 2015-05-22 ENCOUNTER — Encounter: Payer: Self-pay | Admitting: Gynecology

## 2015-05-22 ENCOUNTER — Encounter: Payer: Self-pay | Admitting: Internal Medicine

## 2015-06-19 ENCOUNTER — Ambulatory Visit: Payer: BLUE CROSS/BLUE SHIELD | Admitting: Gynecology

## 2015-06-25 ENCOUNTER — Encounter: Payer: Self-pay | Admitting: Internal Medicine

## 2015-07-10 ENCOUNTER — Ambulatory Visit (INDEPENDENT_AMBULATORY_CARE_PROVIDER_SITE_OTHER): Payer: BLUE CROSS/BLUE SHIELD

## 2015-07-10 ENCOUNTER — Encounter: Payer: Self-pay | Admitting: Gynecology

## 2015-07-10 ENCOUNTER — Ambulatory Visit (INDEPENDENT_AMBULATORY_CARE_PROVIDER_SITE_OTHER): Payer: BLUE CROSS/BLUE SHIELD | Admitting: Gynecology

## 2015-07-10 ENCOUNTER — Other Ambulatory Visit: Payer: Self-pay | Admitting: Gynecology

## 2015-07-10 VITALS — BP 118/76

## 2015-07-10 DIAGNOSIS — Z30431 Encounter for routine checking of intrauterine contraceptive device: Secondary | ICD-10-CM

## 2015-07-10 NOTE — Progress Notes (Addendum)
   Patient is a 25 year old who presented to the office today for 1 month follow-up after placement of Mirena IUD. Patient not sexually active but has no complaints today.  Exam: Abdomen: Soft nontender no rebound or guarding Pelvic: Bartholin urethra Skene was within normal limits Vagina: No lesions or discharge Cervix: IUD string was not visualized  Uterus: Anteverted normal size shape and consistency Adnexa: No palpable mass or tenderness Rectal exam not done  Assessment/plan: Patient one month status post placement Mirena IUD doing well. IUD string was not visualized patient return back to the office in 1-2 weeks for an ultrasound to confirm that the IUD is in the proper position.   addendum: patient return later in the afternoon for her ultrasound. Ultrasound reported the following:  Uterus measures 6.9 x 4.2 x 3.2 cm with endometrial stripe of 6.6 millimeter. IUD was seen in the normal position. Right and left ovary were normal. No fluid in the cul-de-sac.

## 2015-09-16 ENCOUNTER — Ambulatory Visit: Payer: Self-pay | Admitting: Internal Medicine

## 2015-10-08 ENCOUNTER — Encounter: Payer: Self-pay | Admitting: Internal Medicine

## 2015-10-08 ENCOUNTER — Ambulatory Visit (INDEPENDENT_AMBULATORY_CARE_PROVIDER_SITE_OTHER): Payer: BLUE CROSS/BLUE SHIELD | Admitting: Internal Medicine

## 2015-10-08 VITALS — BP 108/60 | HR 106 | Temp 98.3°F | Resp 14 | Ht 63.0 in | Wt 129.0 lb

## 2015-10-08 DIAGNOSIS — F988 Other specified behavioral and emotional disorders with onset usually occurring in childhood and adolescence: Secondary | ICD-10-CM

## 2015-10-08 DIAGNOSIS — F909 Attention-deficit hyperactivity disorder, unspecified type: Secondary | ICD-10-CM

## 2015-10-08 MED ORDER — LISDEXAMFETAMINE DIMESYLATE 20 MG PO CAPS: 20.0000 mg | ORAL_CAPSULE | Freq: Every day | ORAL | Status: DC

## 2015-10-08 NOTE — Assessment & Plan Note (Signed)
She is being treated by psych and cannot get in with them. She is having severe side effects from the vyvanse that she is taking and I will half the dose until she can get in with them. We do have bipolar 1 in her history and it is concerning that she could go into flare if this is a true diagnosis. She is instructed to contact her psych office if symptoms do not improve on the lower dose. She knows that this is a one time prescription and she should continue to go to her psychiatrist.

## 2015-10-08 NOTE — Progress Notes (Signed)
Pre visit review using our clinic review tool, if applicable. No additional management support is needed unless otherwise documented below in the visit note. 

## 2015-10-08 NOTE — Progress Notes (Signed)
   Subjective:    Patient ID: Dawn Porter, female    DOB: 04-04-1991, 25 y.o.   MRN: [id number]  HPI The patient is a 25 YO female coming in for follow up. No recurrent syncope since last visit and the fatigue is gone. She is taking vyvanse from her nurse psychiatrist and has been having increasing side effects from the medicine. She has severe dry mouth, dry eyes, no appetite. She is not able to eat during the day and the medicine lasts about 14 hours. She is then binging as the medicine wears off which makes her feel bad about herself. She is not sleeping and getting only about 4 hours per night. This is making her feel bad and she cannot get in to see her nurse psychiatrist for about 1.5 months and needed help.   Review of Systems  Constitutional: Positive for appetite change. Negative for activity change, fatigue and unexpected weight change.  HENT:       Dry mouth  Eyes:       Eye dryness  Respiratory: Negative.   Cardiovascular: Negative.   Gastrointestinal: Negative.   Neurological: Negative for dizziness, syncope, weakness and headaches.  Psychiatric/Behavioral: Positive for sleep disturbance and decreased concentration. The patient is hyperactive.       Objective:   Physical Exam  Constitutional: She is oriented to person, place, and time. She appears well-developed and well-nourished.  HENT:  Head: Normocephalic and atraumatic.  Eyes: EOM are normal.  Neck: Normal range of motion.  Cardiovascular: Normal rate and regular rhythm.   Pulmonary/Chest: Effort normal and breath sounds normal. No respiratory distress. She has no wheezes. She has no rales.  Abdominal: Soft. She exhibits no distension. There is no tenderness. There is no rebound.  Neurological: She is alert and oriented to person, place, and time. Coordination normal.  Skin: Skin is warm and dry.  Psychiatric:  Some hyperactive during our visit   Filed Vitals:   10/08/15 0844  BP: 108/60  Pulse: 106  Temp:  98.3 F (36.8 C)  TempSrc: Oral  Resp: 14  Height: 5\' 3"  (1.6 m)  Weight: 129 lb (58.514 kg)  SpO2: 99%      Assessment & Plan:

## 2015-10-08 NOTE — Patient Instructions (Signed)
We will cut the vyvanse in half until you can get in with your psychiatrist. We have given you the new prescription for the 20 mg vyvanse.

## 2015-11-06 DIAGNOSIS — Z0100 Encounter for examination of eyes and vision without abnormal findings: Secondary | ICD-10-CM | POA: Diagnosis not present

## 2015-11-15 ENCOUNTER — Telehealth: Payer: Self-pay | Admitting: Internal Medicine

## 2015-11-15 ENCOUNTER — Encounter: Payer: Self-pay | Admitting: Internal Medicine

## 2015-11-18 MED ORDER — LISDEXAMFETAMINE DIMESYLATE 20 MG PO CAPS
20.0000 mg | ORAL_CAPSULE | Freq: Every day | ORAL | Status: DC
Start: 2015-11-18 — End: 2015-12-31

## 2015-11-18 NOTE — Addendum Note (Signed)
Addended by: Pricilla Holm A on: 11/18/2015 01:00 PM   Modules accepted: Orders

## 2015-12-02 ENCOUNTER — Encounter: Payer: Self-pay | Admitting: Internal Medicine

## 2015-12-10 DIAGNOSIS — L700 Acne vulgaris: Secondary | ICD-10-CM | POA: Diagnosis not present

## 2015-12-16 ENCOUNTER — Ambulatory Visit: Payer: Self-pay | Admitting: Internal Medicine

## 2015-12-18 ENCOUNTER — Ambulatory Visit: Payer: BLUE CROSS/BLUE SHIELD | Admitting: Internal Medicine

## 2015-12-19 ENCOUNTER — Ambulatory Visit: Payer: Self-pay | Admitting: Internal Medicine

## 2015-12-31 ENCOUNTER — Encounter: Payer: Self-pay | Admitting: Internal Medicine

## 2015-12-31 ENCOUNTER — Ambulatory Visit (INDEPENDENT_AMBULATORY_CARE_PROVIDER_SITE_OTHER): Payer: BLUE CROSS/BLUE SHIELD | Admitting: Internal Medicine

## 2015-12-31 VITALS — BP 102/66 | HR 96 | Temp 98.8°F | Resp 16 | Ht 63.0 in | Wt 131.1 lb

## 2015-12-31 DIAGNOSIS — F909 Attention-deficit hyperactivity disorder, unspecified type: Secondary | ICD-10-CM

## 2015-12-31 DIAGNOSIS — F988 Other specified behavioral and emotional disorders with onset usually occurring in childhood and adolescence: Secondary | ICD-10-CM

## 2015-12-31 MED ORDER — AMPHETAMINE-DEXTROAMPHETAMINE 10 MG PO TABS: 10.0000 mg | ORAL_TABLET | Freq: Two times a day (BID) | ORAL | Status: DC

## 2015-12-31 MED ORDER — LISDEXAMFETAMINE DIMESYLATE 20 MG PO CAPS: 20.0000 mg | ORAL_CAPSULE | Freq: Every day | ORAL | Status: DC

## 2015-12-31 NOTE — Progress Notes (Signed)
Pre visit review using our clinic review tool, if applicable. No additional management support is needed unless otherwise documented below in the visit note. 

## 2015-12-31 NOTE — Progress Notes (Signed)
   Subjective:    Patient ID: Dawn Porter, female    DOB: 11/13/1990, 25 y.o.   MRN: [id number]  HPI The patient is a 25 YO female coming in for follow up of her ADD/ADHD. She knows that she needs to find a psychiatrist and has visit upcoming August 8th or 9th. She is out of her medication and is trying to take summer classes. She is needing her vyvanse and her adderall. The adderall has less side effects for her. The vyvanse does help longer but she gets problems when it goes out of her system like headaches. Weight is stable. Appetite is poor.   Review of Systems  Constitutional: Positive for appetite change. Negative for activity change, fatigue and unexpected weight change.  HENT:       Dry mouth  Eyes:       Eye dryness  Respiratory: Negative.   Cardiovascular: Negative.   Gastrointestinal: Negative.   Neurological: Negative for dizziness, syncope, weakness and headaches.  Psychiatric/Behavioral: Positive for sleep disturbance and decreased concentration. The patient is hyperactive.       Objective:   Physical Exam  Constitutional: She is oriented to person, place, and time. She appears well-developed and well-nourished.  HENT:  Head: Normocephalic and atraumatic.  Eyes: EOM are normal.  Neck: Normal range of motion.  Cardiovascular: Normal rate and regular rhythm.   Pulmonary/Chest: Effort normal and breath sounds normal. No respiratory distress. She has no wheezes. She has no rales.  Abdominal: Soft. She exhibits no distension. There is no tenderness. There is no rebound.  Neurological: She is alert and oriented to person, place, and time. Coordination normal.  Skin: Skin is warm and dry.  Psychiatric:  Some hyperactive during our visit   Filed Vitals:   12/31/15 1327  BP: 102/66  Pulse: 96  Temp: 98.8 F (37.1 C)  TempSrc: Oral  Resp: 16  Height: 5\' 3"  (1.6 m)  Weight: 131 lb 1.9 oz (59.476 kg)  SpO2: 98%      Assessment & Plan:

## 2015-12-31 NOTE — Assessment & Plan Note (Signed)
She knows that I will not treat this condition long term and 1 more rx for adderall and vyvanse is the last time I will rx. She needs to keep visit with psych to continue this medication and will not refill.

## 2015-12-31 NOTE — Patient Instructions (Signed)
We have given you the prescription for the adderall and the vyvanse to get you through until the new visit. Please keep the visit with the attention specialist as I do not treat this condition long term.

## 2016-01-22 DIAGNOSIS — F429 Obsessive-compulsive disorder, unspecified: Secondary | ICD-10-CM | POA: Diagnosis not present

## 2016-01-22 DIAGNOSIS — R41840 Attention and concentration deficit: Secondary | ICD-10-CM | POA: Diagnosis not present

## 2016-01-22 DIAGNOSIS — F338 Other recurrent depressive disorders: Secondary | ICD-10-CM | POA: Diagnosis not present

## 2016-01-22 DIAGNOSIS — Z79899 Other long term (current) drug therapy: Secondary | ICD-10-CM | POA: Diagnosis not present

## 2016-01-22 DIAGNOSIS — F419 Anxiety disorder, unspecified: Secondary | ICD-10-CM | POA: Diagnosis not present

## 2016-02-03 ENCOUNTER — Ambulatory Visit (INDEPENDENT_AMBULATORY_CARE_PROVIDER_SITE_OTHER): Payer: BLUE CROSS/BLUE SHIELD | Admitting: Psychology

## 2016-02-03 DIAGNOSIS — F32A Depression, unspecified: Secondary | ICD-10-CM

## 2016-02-03 DIAGNOSIS — S060X9S Concussion with loss of consciousness of unspecified duration, sequela: Secondary | ICD-10-CM

## 2016-02-03 DIAGNOSIS — F4310 Post-traumatic stress disorder, unspecified: Secondary | ICD-10-CM

## 2016-02-03 DIAGNOSIS — F329 Major depressive disorder, single episode, unspecified: Secondary | ICD-10-CM
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
## 2016-02-06 ENCOUNTER — Encounter (INDEPENDENT_AMBULATORY_CARE_PROVIDER_SITE_OTHER): Payer: BLUE CROSS/BLUE SHIELD

## 2016-02-10 ENCOUNTER — Encounter: Payer: Self-pay | Admitting: Gynecology

## 2016-02-10 ENCOUNTER — Ambulatory Visit (INDEPENDENT_AMBULATORY_CARE_PROVIDER_SITE_OTHER): Payer: BLUE CROSS/BLUE SHIELD | Admitting: Gynecology

## 2016-02-10 VITALS — BP 104/72 | Ht 63.0 in | Wt 128.0 lb

## 2016-02-10 DIAGNOSIS — Z01419 Encounter for gynecological examination (general) (routine) without abnormal findings: Secondary | ICD-10-CM | POA: Diagnosis not present

## 2016-02-10 NOTE — Progress Notes (Signed)
Dawn Porter [birth year] [id number]   History:    25 y.o.  for annual gyn exam with a complaint of discomfort cramping and spotting with the Mirena IUD that was placed 05/15/2015. Patient has not been sexually active not indicated since last year. She has completed the HPV vaccine series. Patient with no previous history of any abnormal Pap smear.  Past medical history,surgical history, family history and social history were all reviewed and documented in the EPIC chart.  Gynecologic History No LMP recorded. Patient is not currently having periods (Reason: IUD). Contraception: IUD Last Pap: 2015. Results were: normal Last mammogram: Not indicated. Results were: Not indicated  Obstetric History OB History  Gravida Para Term Preterm AB Living  0            SAB TAB Ectopic Multiple Live Births                    ROS: A ROS was performed and pertinent positives and negatives are included in the history.  GENERAL: No fevers or chills. HEENT: No change in vision, no earache, sore throat or sinus congestion. NECK: No pain or stiffness. CARDIOVASCULAR: No chest pain or pressure. No palpitations. PULMONARY: No shortness of breath, cough or wheeze. GASTROINTESTINAL: No abdominal pain, nausea, vomiting or diarrhea, melena or bright red blood per rectum. GENITOURINARY: No urinary frequency, urgency, hesitancy or dysuria. MUSCULOSKELETAL: No joint or muscle pain, no back pain, no recent trauma. DERMATOLOGIC: No rash, no itching, no lesions. ENDOCRINE: No polyuria, polydipsia, no heat or cold intolerance. No recent change in weight. HEMATOLOGICAL: No anemia or easy bruising or bleeding. NEUROLOGIC: No headache, seizures, numbness, tingling or weakness. PSYCHIATRIC: No depression, no loss of interest in normal activity or change in sleep pattern.     Exam: chaperone present  BP 104/72   Ht 5\' 3"  (1.6 m)   Wt 128 lb (58.1 kg)   BMI 22.67 kg/m   Body mass index is 22.67 kg/m.  General  appearance : Well developed well nourished female. No acute distress HEENT: Eyes: no retinal hemorrhage or exudates,  Neck supple, trachea midline, no carotid bruits, no thyroidmegaly Lungs: Clear to auscultation, no rhonchi or wheezes, or rib retractions  Heart: Regular rate and rhythm, no murmurs or gallops Breast:Examined in sitting and supine position were symmetrical in appearance, no palpable masses or tenderness,  no skin retraction, no nipple inversion, no nipple discharge, no skin discoloration, no axillary or supraclavicular lymphadenopathy Abdomen: no palpable masses or tenderness, no rebound or guarding Extremities: no edema or skin discoloration or tenderness  Pelvic:  Bartholin, Urethra, Skene Glands: Within normal limits             Vagina: No gross lesions or discharge  Cervix: No gross lesions or discharge  Uterus  anteverted, normal size, shape and consistency, non-tender and mobile  Adnexa  Without masses or tenderness  Anus and perineum  normal   Rectovaginal  normal sphincter tone without palpated masses or tenderness             Hemoccult not indicated     Assessment/Plan:  25 y.o. female for annual exam who wanted to have the Mirena IUD removed. It was very difficult we could not see the IUD string patient uncomfortable and for this reason she will return back to the office next week. I've given a prescription for Valium 5 mg to take 1 by mouth the night before and then 5 mg the morning of the  procedure and we will give her a paracervical block and under ultrasound guidance we'll dilate her cervix and remove the IUD. She is not sexually active that if she becomes sexually active she says she will use condoms. When she comes in to have the IUD removed she'll be in a fasting state and the following blood work will be drawn: Comprehensive metabolic panel, fasting lipid profile, TSH, CBC, and urinalysis. Pap smear not indicated this year.   Terrance Mass MD, 4:49 PM  02/10/2016

## 2016-02-11 ENCOUNTER — Other Ambulatory Visit: Payer: Self-pay | Admitting: Gynecology

## 2016-02-11 DIAGNOSIS — Z30432 Encounter for removal of intrauterine contraceptive device: Secondary | ICD-10-CM

## 2016-02-12 ENCOUNTER — Ambulatory Visit (INDEPENDENT_AMBULATORY_CARE_PROVIDER_SITE_OTHER): Payer: BLUE CROSS/BLUE SHIELD

## 2016-02-12 ENCOUNTER — Encounter: Payer: Self-pay | Admitting: Gynecology

## 2016-02-12 ENCOUNTER — Ambulatory Visit (INDEPENDENT_AMBULATORY_CARE_PROVIDER_SITE_OTHER): Payer: BLUE CROSS/BLUE SHIELD | Admitting: Gynecology

## 2016-02-12 VITALS — BP 108/70 | HR 76

## 2016-02-12 DIAGNOSIS — R102 Pelvic and perineal pain: Secondary | ICD-10-CM | POA: Diagnosis not present

## 2016-02-12 DIAGNOSIS — Z01419 Encounter for gynecological examination (general) (routine) without abnormal findings: Secondary | ICD-10-CM

## 2016-02-12 DIAGNOSIS — Z30432 Encounter for removal of intrauterine contraceptive device: Secondary | ICD-10-CM

## 2016-02-12 LAB — CBC WITH DIFFERENTIAL/PLATELET
BASOS ABS: 0 {cells}/uL (ref 0–200)
Basophils Relative: 0 %
EOS PCT: 4 %
Eosinophils Absolute: 212 cells/uL (ref 15–500)
HEMATOCRIT: 42 % (ref 35.0–45.0)
HEMOGLOBIN: 14 g/dL (ref 11.7–15.5)
LYMPHS ABS: 1961 {cells}/uL (ref 850–3900)
LYMPHS PCT: 37 %
MCH: 28.9 pg (ref 27.0–33.0)
MCHC: 33.3 g/dL (ref 32.0–36.0)
MCV: 86.8 fL (ref 80.0–100.0)
MONO ABS: 583 {cells}/uL (ref 200–950)
MPV: 10.4 fL (ref 7.5–12.5)
Monocytes Relative: 11 %
NEUTROS PCT: 48 %
Neutro Abs: 2544 cells/uL (ref 1500–7800)
Platelets: 354 10*3/uL (ref 140–400)
RBC: 4.84 MIL/uL (ref 3.80–5.10)
RDW: 12.8 % (ref 11.0–15.0)
WBC: 5.3 10*3/uL (ref 3.8–10.8)

## 2016-02-12 LAB — LIPID PANEL
CHOLESTEROL: 107 mg/dL — AB (ref 125–200)
HDL: 52 mg/dL (ref >=46)
LDL Cholesterol: 47 mg/dL (ref <130)
Total CHOL/HDL Ratio: 2.1 Ratio (ref <=5.0)
Triglycerides: 40 mg/dL (ref <150)
VLDL: 8 mg/dL (ref <30)

## 2016-02-12 LAB — TSH: TSH: 1.19 mIU/L

## 2016-02-12 LAB — COMPREHENSIVE METABOLIC PANEL
ALBUMIN: 4.1 g/dL (ref 3.6–5.1)
ALK PHOS: 51 U/L (ref 33–115)
ALT: 11 U/L (ref 6–29)
AST: 15 U/L (ref 10–30)
BILIRUBIN TOTAL: 0.6 mg/dL (ref 0.2–1.2)
BUN: 22 mg/dL (ref 7–25)
CO2: 25 mmol/L (ref 20–31)
CREATININE: 0.7 mg/dL (ref 0.50–1.10)
Calcium: 9.7 mg/dL (ref 8.6–10.2)
Chloride: 105 mmol/L (ref 98–110)
Glucose, Bld: 81 mg/dL (ref 65–99)
POTASSIUM: 4.7 mmol/L (ref 3.5–5.3)
Sodium: 138 mmol/L (ref 135–146)
Total Protein: 6.7 g/dL (ref 6.1–8.1)

## 2016-02-12 MED ORDER — LIDOCAINE HCL 1 % IJ SOLN
10.0000 mL | Freq: Once | INTRAMUSCULAR | Status: AC
  Administered 2016-02-12: 10 mL

## 2016-02-12 MED ORDER — DOXYCYCLINE HYCLATE 100 MG PO CAPS: 100.0000 mg | ORAL_CAPSULE | Freq: Two times a day (BID) | ORAL | 0 refills | Status: DC

## 2016-02-12 NOTE — Patient Instructions (Signed)
Doxycycline tablets or capsules What is this medicine? DOXYCYCLINE (dox i SYE kleen) is a tetracycline antibiotic. It kills certain bacteria or stops their growth. It is used to treat many kinds of infections, like dental, skin, respiratory, and urinary tract infections. It also treats acne, Lyme disease, malaria, and certain sexually transmitted infections. This medicine may be used for other purposes; ask your health care provider or pharmacist if you have questions. What should I tell my health care provider before I take this medicine? They need to know if you have any of these conditions: -liver disease -long exposure to sunlight like working outdoors -stomach problems like colitis -an unusual or allergic reaction to doxycycline, tetracycline antibiotics, other medicines, foods, dyes, or preservatives -pregnant or trying to get pregnant -breast-feeding How should I use this medicine? Take this medicine by mouth with a full glass of water. Follow the directions on the prescription label. It is best to take this medicine without food, but if it upsets your stomach take it with food. Take your medicine at regular intervals. Do not take your medicine more often than directed. Take all of your medicine as directed even if you think you are better. Do not skip doses or stop your medicine early. Talk to your pediatrician regarding the use of this medicine in children. While this drug may be prescribed for selected conditions, precautions do apply. Overdosage: If you think you have taken too much of this medicine contact a poison control center or emergency room at once. NOTE: This medicine is only for you. Do not share this medicine with others. What if I miss a dose? If you miss a dose, take it as soon as you can. If it is almost time for your next dose, take only that dose. Do not take double or extra doses. What may interact with this medicine? -antacids -barbiturates -birth control  pills -bismuth subsalicylate -carbamazepine -methoxyflurane -other antibiotics -phenytoin -vitamins that contain iron -warfarin This list may not describe all possible interactions. Give your health care provider a list of all the medicines, herbs, non-prescription drugs, or dietary supplements you use. Also tell them if you smoke, drink alcohol, or use illegal drugs. Some items may interact with your medicine. What should I watch for while using this medicine? Tell your doctor or health care professional if your symptoms do not improve. Do not treat diarrhea with over the counter products. Contact your doctor if you have diarrhea that lasts more than 2 days or if it is severe and watery. Do not take this medicine just before going to bed. It may not dissolve properly when you lay down and can cause pain in your throat. Drink plenty of fluids while taking this medicine to also help reduce irritation in your throat. This medicine can make you more sensitive to the sun. Keep out of the sun. If you cannot avoid being in the sun, wear protective clothing and use sunscreen. Do not use sun lamps or tanning beds/booths. Birth control pills may not work properly while you are taking this medicine. Talk to your doctor about using an extra method of birth control. If you are being treated for a sexually transmitted infection, avoid sexual contact until you have finished your treatment. Your sexual partner may also need treatment. Avoid antacids, aluminum, calcium, magnesium, and iron products for 4 hours before and 2 hours after taking a dose of this medicine. If you are using this medicine to prevent malaria, you should still protect yourself from contact  with mosquitos. Stay in screened-in areas, use mosquito nets, keep your body covered, and use an insect repellent. What side effects may I notice from receiving this medicine? Side effects that you should report to your doctor or health care professional  as soon as possible: -allergic reactions like skin rash, itching or hives, swelling of the face, lips, or tongue -difficulty breathing -fever -itching in the rectal or genital area -pain on swallowing -redness, blistering, peeling or loosening of the skin, including inside the mouth -severe stomach pain or cramps -unusual bleeding or bruising -unusually weak or tired -yellowing of the eyes or skin Side effects that usually do not require medical attention (report to your doctor or health care professional if they continue or are bothersome): -diarrhea -loss of appetite -nausea, vomiting This list may not describe all possible side effects. Call your doctor for medical advice about side effects. You may report side effects to FDA at 1-800-FDA-1088. Where should I keep my medicine? Keep out of the reach of children. Store at room temperature, below 30 degrees C (86 degrees F). Protect from light. Keep container tightly closed. Throw away any unused medicine after the expiration date. Taking this medicine after the expiration date can make you seriously ill. NOTE: This sheet is a summary. It may not cover all possible information. If you have questions about this medicine, talk to your doctor, pharmacist, or health care provider.    2016, Elsevier/Gold Standard. (2014-09-21 12:10:28)

## 2016-02-12 NOTE — Progress Notes (Signed)
   Patient is a 25 year old who was seen early this week requesting to remove her Mirena IUD as a result of cramping and spotting. We attended to remove the IUD in the last office visit on August 28 of this year but was unsuccessful so she returns today in an effort to remove it with the utilization of the ultrasound. She was given a prescription of Valium 5 mg to take the night before the procedure and again this morning. She also took ibuprofen 800 mg before coming to the office today. She was also fasting today and was to get the following blood work done today: Fasting lipid profile, comprehensive metabolic panel, TSH, CBC, and urinalysis.  Procedure note: The speculum was introduced into the vagina this cervix was cleansed with Betadine solution. A single-tooth tenaculum was placed on the anterior cervical lip. 1% lidocaine was infiltrated into the cervical stroma at the 2, 4, 8, and 10:00 position for a total of 3 cc. A small hole was introduced into the uterine cavity under ultrasound guidance in an effort to retrieve the string but was successful. We then under ultrasound guidance introduced a [place] clamp it was able to retrieve the IUD shown to the patient discarded. The single-tooth tenaculum was then removed.  Assessment/plan ultrasound-guided retrieval of IUD. Patient not sexually active she does she will use barrier contraception. To prevent an endometritis patient will be prescribed Vibramycin 100 mg one by mouth twice a day for 7 days. Also her screening blood work was done today.  The ultrasound demonstrated uterus that measures 7.7 x 4.6 x 2.9 cm with endometrial stripe of 3.2 mm. Prior term over the IUD it was visualized in the uterine cavity. Right left ovary were otherwise normal.

## 2016-02-13 LAB — URINALYSIS W MICROSCOPIC + REFLEX CULTURE
BACTERIA UA: NONE SEEN [HPF]
Bilirubin Urine: NEGATIVE
CRYSTALS: NONE SEEN [HPF]
Casts: NONE SEEN [LPF]
Glucose, UA: NEGATIVE
Hgb urine dipstick: NEGATIVE
Ketones, ur: NEGATIVE
Leukocytes, UA: NEGATIVE
Nitrite: NEGATIVE
PROTEIN: NEGATIVE
Specific Gravity, Urine: 1.039 — ABNORMAL HIGH (ref 1.001–1.035)
YEAST: NONE SEEN [HPF]
pH: 5.5 (ref 5.0–8.0)

## 2016-02-14 LAB — URINE CULTURE: ORGANISM ID, BACTERIA: NO GROWTH

## 2016-02-17 ENCOUNTER — Encounter: Payer: Self-pay | Admitting: Psychology

## 2016-02-18 ENCOUNTER — Encounter: Payer: Self-pay | Admitting: Gynecology

## 2016-02-18 NOTE — Telephone Encounter (Signed)
Please see pt's mychart message. WOuld like to reschedule appointment with [facility].

## 2016-02-20 ENCOUNTER — Encounter: Payer: Self-pay | Admitting: Psychology

## 2016-02-25 ENCOUNTER — Encounter: Payer: Self-pay | Admitting: Psychology

## 2016-02-26 ENCOUNTER — Other Ambulatory Visit: Payer: Self-pay | Admitting: Internal Medicine

## 2016-02-27 MED ORDER — AMPHETAMINE-DEXTROAMPHETAMINE 10 MG PO TABS: 10.0000 mg | ORAL_TABLET | Freq: Two times a day (BID) | ORAL | 0 refills | Status: DC

## 2016-02-27 NOTE — Telephone Encounter (Signed)
Called pt no answer [facility] rx ready for pick-up...lmb

## 2016-03-03 ENCOUNTER — Encounter: Payer: Self-pay | Admitting: Internal Medicine

## 2016-03-04 DIAGNOSIS — Z029 Encounter for administrative examinations, unspecified: Secondary | ICD-10-CM

## 2016-03-05 ENCOUNTER — Encounter: Payer: Self-pay | Admitting: Psychology

## 2016-03-05 ENCOUNTER — Encounter: Payer: Self-pay | Admitting: Internal Medicine

## 2016-03-12 ENCOUNTER — Ambulatory Visit (INDEPENDENT_AMBULATORY_CARE_PROVIDER_SITE_OTHER)
Admission: RE | Admit: 2016-03-12 | Discharge: 2016-03-12 | Disposition: A | Discharge status: Home / Self Care | Payer: BLUE CROSS/BLUE SHIELD | Source: Ambulatory Visit | Attending: Family Medicine | Admitting: Family Medicine

## 2016-03-12 ENCOUNTER — Encounter: Payer: Self-pay | Admitting: Internal Medicine

## 2016-03-12 ENCOUNTER — Ambulatory Visit (INDEPENDENT_AMBULATORY_CARE_PROVIDER_SITE_OTHER): Payer: BLUE CROSS/BLUE SHIELD | Admitting: Family Medicine

## 2016-03-12 ENCOUNTER — Encounter: Payer: Self-pay | Admitting: Emergency Medicine

## 2016-03-12 ENCOUNTER — Encounter: Payer: Self-pay | Admitting: Family Medicine

## 2016-03-12 ENCOUNTER — Telehealth: Payer: Self-pay | Admitting: Internal Medicine

## 2016-03-12 VITALS — BP 106/70 | HR 92 | Temp 97.9°F | Wt 129.6 lb

## 2016-03-12 DIAGNOSIS — R319 Hematuria, unspecified: Secondary | ICD-10-CM

## 2016-03-12 DIAGNOSIS — R109 Unspecified abdominal pain: Secondary | ICD-10-CM

## 2016-03-12 DIAGNOSIS — R1012 Left upper quadrant pain: Secondary | ICD-10-CM | POA: Diagnosis not present

## 2016-03-12 LAB — POCT URINALYSIS DIPSTICK
Bilirubin, UA: NEGATIVE
GLUCOSE UA: NEGATIVE
Ketones, UA: NEGATIVE
LEUKOCYTES UA: NEGATIVE
NITRITE UA: NEGATIVE
PROTEIN UA: NEGATIVE
UROBILINOGEN UA: 0.2
pH, UA: 7

## 2016-03-12 LAB — POCT URINE PREGNANCY: PREG TEST UR: NEGATIVE

## 2016-03-12 MED ORDER — KETOROLAC TROMETHAMINE 60 MG/2ML IM SOLN
60.0000 mg | Freq: Once | INTRAMUSCULAR | Status: AC
  Administered 2016-03-12: 60 mg via INTRAMUSCULAR

## 2016-03-12 MED ORDER — HYDROCODONE-ACETAMINOPHEN 10-325 MG PO TABS: 1.0000 | ORAL_TABLET | Freq: Three times a day (TID) | ORAL | 0 refills | Status: DC | PRN

## 2016-03-12 NOTE — Telephone Encounter (Signed)
Patient Name: Dawn Porter  DOB: 04-Jul-1990    Initial Comment Caller states she's having pain in her kidneys.   Nurse Assessment  Nurse: Raphael Gibney, RN, Vanita Ingles Date/Time (Eastern Time): 03/12/2016 7:33:34 AM  Confirm and document reason for call. If symptomatic, describe symptoms. You must click the next button to save text entered. ---Caller states she is having kidney pain on both sides but it is worse on the left. She has sinus congestion. Yesterday she had blood in her urine. Pain level 6. No pain with urination. No fever.  Has the patient traveled out of the country within the last 30 days? ---No  Does the patient have any new or worsening symptoms? ---Yes  Will a triage be completed? ---Yes  Related visit to physician within the last 2 weeks? ---No  Does the PT have any chronic conditions? (i.e. diabetes, asthma, etc.) ---No  Is the patient pregnant or possibly pregnant? (Ask all females between the ages of 66-55) ---No  Is this a behavioral health or substance abuse call? ---No     Guidelines    Guideline Title Affirmed Question Affirmed Notes  Flank Pain MODERATE pain (e.g., interferes with normal activities or awakens from sleep)    Final Disposition User   See Physician within 24 Hours [place], RN, Vanita Ingles    Comments  No appts available at [facility] in time frame that pt needs appt scheduled for 03/12/2016 at 8:45 am with Janace Hoard at [place].   Referrals  REFERRED TO PCP OFFICE   Disagree/Comply: Comply

## 2016-03-12 NOTE — Progress Notes (Signed)
Pre visit review using our clinic review tool, if applicable. No additional management support is needed unless otherwise documented below in the visit note. 
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
## 2016-03-12 NOTE — Patient Instructions (Addendum)
You will be contacted regarding your CT scan. Please increase fluids and you will be notified regarding your results of your scan.   Kidney Stones Kidney stones (urolithiasis) are deposits that form inside your kidneys. The intense pain is caused by the stone moving through the urinary tract. When the stone moves, the ureter goes into spasm around the stone. The stone is usually passed in the urine.  CAUSES   A disorder that makes certain neck glands produce too much parathyroid hormone (primary hyperparathyroidism).  A buildup of uric acid crystals, similar to gout in your joints.  Narrowing (stricture) of the ureter.  A kidney obstruction present at birth (congenital obstruction).  Previous surgery on the kidney or ureters.  Numerous kidney infections. SYMPTOMS   Feeling sick to your stomach (nauseous).  Throwing up (vomiting).  Blood in the urine (hematuria).  Pain that usually spreads (radiates) to the groin.  Frequency or urgency of urination. DIAGNOSIS   Taking a history and physical exam.  Blood or urine tests.  CT scan.  Occasionally, an examination of the inside of the urinary bladder (cystoscopy) is performed. TREATMENT   Observation.  Increasing your fluid intake.  Extracorporeal shock wave lithotripsy--This is a noninvasive procedure that uses shock waves to break up kidney stones.  Surgery may be needed if you have severe pain or persistent obstruction. There are various surgical procedures. Most of the procedures are performed with the use of small instruments. Only small incisions are needed to accommodate these instruments, so recovery time is minimized. The size, location, and chemical composition are all important variables that will determine the proper choice of action for you. Talk to your health care provider to better understand your situation so that you will minimize the risk of injury to yourself and your kidney.  HOME CARE INSTRUCTIONS    Drink enough water and fluids to keep your urine clear or pale yellow. This will help you to pass the stone or stone fragments.  Strain all urine through the provided strainer. Keep all particulate matter and stones for your health care provider to see. The stone causing the pain may be as small as a grain of salt. It is very important to use the strainer each and every time you pass your urine. The collection of your stone will allow your health care provider to analyze it and verify that a stone has actually passed. The stone analysis will often identify what you can do to reduce the incidence of recurrences.  Only take over-the-counter or prescription medicines for pain, discomfort, or fever as directed by your health care provider.  Keep all follow-up visits as told by your health care provider. This is important.  Get follow-up X-rays if required. The absence of pain does not always mean that the stone has passed. It may have only stopped moving. If the urine remains completely obstructed, it can cause loss of kidney function or even complete destruction of the kidney. It is your responsibility to make sure X-rays and follow-ups are completed. Ultrasounds of the kidney can show blockages and the status of the kidney. Ultrasounds are not associated with any radiation and can be performed easily in a matter of minutes.  Make changes to your daily diet as told by your health care provider. You may be told to:  Limit the amount of salt that you eat.  Eat 5 or more servings of fruits and vegetables each day.  Limit the amount of meat, poultry, fish, and eggs that  you eat.  Collect a 24-hour urine sample as told by your health care provider.You may need to collect another urine sample every 6-12 months. SEEK MEDICAL CARE IF:  You experience pain that is progressive and unresponsive to any pain medicine you have been prescribed. SEEK IMMEDIATE MEDICAL CARE IF:   Pain cannot be controlled  with the prescribed medicine.  You have a fever or shaking chills.  The severity or intensity of pain increases over 18 hours and is not relieved by pain medicine.  You develop a new onset of abdominal pain.  You feel faint or pass out.  You are unable to urinate.   This information is not intended to replace advice given to you by your health care provider. Make sure you discuss any questions you have with your health care provider.   Document Released: 06/01/2005 Document Revised: 02/20/2015 Document Reviewed: 11/02/2012 Elsevier Interactive Patient Education [employer].

## 2016-03-18 ENCOUNTER — Ambulatory Visit (INDEPENDENT_AMBULATORY_CARE_PROVIDER_SITE_OTHER): Payer: BLUE CROSS/BLUE SHIELD | Admitting: Psychology

## 2016-03-18 DIAGNOSIS — F4310 Post-traumatic stress disorder, unspecified: Secondary | ICD-10-CM

## 2016-03-18 DIAGNOSIS — S060X9S Concussion with loss of consciousness of unspecified duration, sequela: Secondary | ICD-10-CM

## 2016-03-18 DIAGNOSIS — F329 Major depressive disorder, single episode, unspecified: Secondary | ICD-10-CM

## 2016-03-18 DIAGNOSIS — F32A Depression, unspecified: Secondary | ICD-10-CM

## 2016-03-18 NOTE — Progress Notes (Signed)
   Neuropsychology Note  Dawn Porter returned today for 3 hours of neuropsychological testing with technician, Milana Kidney, BS, under the supervision of Dr. Macarthur Critchley. The patient did not appear overtly distressed by the testing session, per behavioral observation or via self-report to the technician. Rest breaks were offered. Dawn Porter will return within 2 weeks for a feedback session with Dr. Si Raider at which time her test performances, clinical impressions and treatment recommendations will be reviewed in detail. The patient understands she can contact our office should she require our assistance before this time.  Full report to follow.

## 2016-04-01 ENCOUNTER — Other Ambulatory Visit: Payer: Self-pay | Admitting: Internal Medicine

## 2016-04-03 ENCOUNTER — Other Ambulatory Visit: Payer: Self-pay | Admitting: Family

## 2016-04-03 MED ORDER — AMPHETAMINE-DEXTROAMPHETAMINE 10 MG PO TABS: 10.0000 mg | ORAL_TABLET | Freq: Two times a day (BID) | ORAL | 0 refills | Status: DC
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
## 2016-04-09 ENCOUNTER — Encounter: Payer: Self-pay | Admitting: Psychology

## 2016-04-09 ENCOUNTER — Ambulatory Visit (INDEPENDENT_AMBULATORY_CARE_PROVIDER_SITE_OTHER): Payer: BLUE CROSS/BLUE SHIELD | Admitting: Psychology

## 2016-04-09 DIAGNOSIS — F411 Generalized anxiety disorder: Secondary | ICD-10-CM

## 2016-04-09 DIAGNOSIS — S060X9S Concussion with loss of consciousness of unspecified duration, sequela: Secondary | ICD-10-CM | POA: Diagnosis not present

## 2016-04-09 DIAGNOSIS — F429 Obsessive-compulsive disorder, unspecified: Secondary | ICD-10-CM

## 2016-04-09 DIAGNOSIS — F331 Major depressive disorder, recurrent, moderate: Secondary | ICD-10-CM

## 2016-04-13 ENCOUNTER — Encounter: Payer: Self-pay | Admitting: Psychology

## 2016-04-16 ENCOUNTER — Encounter: Payer: Self-pay | Admitting: Psychology

## 2016-04-30 ENCOUNTER — Encounter: Payer: Self-pay | Admitting: Psychology

## 2016-05-05 ENCOUNTER — Encounter: Payer: Self-pay | Admitting: Nurse Practitioner

## 2016-05-05 ENCOUNTER — Encounter: Payer: Self-pay | Admitting: Emergency Medicine

## 2016-05-05 ENCOUNTER — Ambulatory Visit (INDEPENDENT_AMBULATORY_CARE_PROVIDER_SITE_OTHER): Payer: BLUE CROSS/BLUE SHIELD | Admitting: Nurse Practitioner

## 2016-05-05 VITALS — BP 104/80 | HR 78 | Temp 97.6°F | Resp 16 | Wt 128.0 lb

## 2016-05-05 DIAGNOSIS — W108XXA Fall (on) (from) other stairs and steps, initial encounter: Secondary | ICD-10-CM

## 2016-05-05 DIAGNOSIS — S060X0A Concussion without loss of consciousness, initial encounter: Secondary | ICD-10-CM

## 2016-05-05 DIAGNOSIS — T148XXA Other injury of unspecified body region, initial encounter: Secondary | ICD-10-CM | POA: Diagnosis not present

## 2016-05-05 MED ORDER — METHOCARBAMOL 500 MG PO TABS: 500.0000 mg | ORAL_TABLET | Freq: Every evening | ORAL | 0 refills | Status: DC | PRN

## 2016-05-05 MED ORDER — IBUPROFEN-FAMOTIDINE 800-26.6 MG PO TABS
1.0000 | ORAL_TABLET | Freq: Three times a day (TID) | ORAL | 0 refills | Status: DC | PRN
Start: 2016-05-05 — End: 2016-06-23

## 2016-05-05 MED ORDER — IBUPROFEN-FAMOTIDINE 800-26.6 MG PO TABS: 1.0000 | ORAL_TABLET | Freq: Three times a day (TID) | ORAL | 0 refills | Status: DC | PRN

## 2016-05-05 NOTE — Progress Notes (Signed)
Pre visit review using our clinic review tool, if applicable. No additional management support is needed unless otherwise documented below in the visit note. 

## 2016-05-05 NOTE — Progress Notes (Signed)
Subjective:  Patient ID: Dawn Porter, female    DOB: May 09, 1991  Age: 25 y.o. MRN: [id number]  CC: Fall (fell backwards down steps xs 2 nights ago, did experience headache & nausea after fall. lower back  & elbow pain. [address] down 6 steps. )   Fall  The accident occurred 3 to 5 days ago. The fall occurred while walking (tripped on her cat and fell down 6steps at home). She fell from an unknown height. She landed on hard floor. The point of impact was the head, left elbow, right elbow and buttocks (and back). The pain is present in the neck, buttocks, left elbow and right elbow. The symptoms are aggravated by movement. Associated symptoms include headaches and nausea. Pertinent negatives include no abdominal pain, bowel incontinence, fever, hearing loss, loss of consciousness, numbness, tingling, visual change or vomiting. She has tried acetaminophen, NSAID and ice for the symptoms. The treatment provided significant relief.    Outpatient Medications Prior to Visit  Medication Sig Dispense Refill  . fexofenadine (ALLEGRA) 180 MG tablet Take 180 mg by mouth daily.    . fluticasone (FLONASE) 50 MCG/ACT nasal spray Reported on 12/31/2015  4  . montelukast (SINGULAIR) 10 MG tablet Reported on 12/31/2015  2  . Multiple Vitamin (MULTIVITAMIN) tablet Take 1 tablet by mouth daily. For nutritional supplementation. 30 tablet 0  . amphetamine-dextroamphetamine (ADDERALL) 10 MG tablet Take 1 tablet (10 mg total) by mouth 2 (two) times daily with a meal. Reported on 10/08/2015 60 tablet 0  . doxycycline (VIBRAMYCIN) 100 MG capsule Take 1 capsule (100 mg total) by mouth 2 (two) times daily. Take one tablet twice a day for 1 week 14 capsule 0  . HYDROcodone-acetaminophen (NORCO) 10-325 MG tablet Take 1 tablet by mouth every 8 (eight) hours as needed. 30 tablet 0  . lisdexamfetamine (VYVANSE) 20 MG capsule Take 1 capsule (20 mg total) by mouth daily. 30 capsule 0  . VYVANSE 40 MG capsule Take 1 tablet by mouth  daily. Reported on 12/31/2015     No facility-administered medications prior to visit.     ROS See HPI  Objective:  BP 104/80   Pulse 78   Temp 97.6 F (36.4 C) (Oral)   Resp 16   Wt 128 lb (58.1 kg)   SpO2 98%   BMI 22.67 kg/m   BP Readings from Last 3 Encounters:  05/05/16 104/80  03/12/16 106/70  02/12/16 108/70    Wt Readings from Last 3 Encounters:  05/05/16 128 lb (58.1 kg)  03/12/16 129 lb 9.6 oz (58.8 kg)  02/10/16 128 lb (58.1 kg)    Physical Exam  Constitutional: She is oriented to person, place, and time. No distress.  Eyes: Conjunctivae and EOM are normal. Pupils are equal, round, and reactive to light. No scleral icterus.  Neck: Normal range of motion. Neck supple. No tracheal deviation present.  Cardiovascular: Normal rate and normal heart sounds.   Pulmonary/Chest: Effort normal and breath sounds normal. No stridor.  Abdominal: Soft. Bowel sounds are normal.  Musculoskeletal: Normal range of motion. She exhibits no edema.       Cervical back: She exhibits tenderness and pain. She exhibits no bony tenderness.       Lumbar back: She exhibits tenderness and pain. She exhibits no bony tenderness and no spasm.  Lymphadenopathy:    She has no cervical adenopathy.  Neurological: She is alert and oriented to person, place, and time. She has normal reflexes. No cranial nerve deficit.  Coordination normal.  Vitals reviewed.   Lab Results  Component Value Date   WBC 5.3 02/12/2016   HGB 14.0 02/12/2016   HCT 42.0 02/12/2016   PLT 354 02/12/2016   GLUCOSE 81 02/12/2016   CHOL 107 (L) 02/12/2016   TRIG 40 02/12/2016   HDL 52 02/12/2016   LDLCALC 47 02/12/2016   ALT 11 02/12/2016   AST 15 02/12/2016   NA 138 02/12/2016   K 4.7 02/12/2016   CL 105 02/12/2016   CREATININE 0.70 02/12/2016   BUN 22 02/12/2016   CO2 25 02/12/2016   TSH 1.19 02/12/2016    Ct Renal Stone Study  Result Date: 03/12/2016 CLINICAL DATA:  Bilateral, left greater than right,  flank pain since yesterday with hematuria. EXAM: CT ABDOMEN AND PELVIS WITHOUT CONTRAST TECHNIQUE: Multidetector CT imaging of the abdomen and pelvis was performed following the standard protocol without IV contrast. COMPARISON:  None. FINDINGS: Lower chest: No acute abnormality. Hepatobiliary: No focal liver abnormality is seen. No gallstones, gallbladder wall thickening, or biliary dilatation. Pancreas: Unremarkable. No pancreatic ductal dilatation or surrounding inflammatory changes. Spleen: Normal in size without focal abnormality. Adrenals/Urinary Tract: Adrenal glands are unremarkable. Kidneys are normal, without renal calculi, focal lesion, or hydronephrosis. Bladder is unremarkable. Stomach/Bowel: Stomach, stomach and small bowel unremarkable. Mild increased stool noted in the rectosigmoid. Colon otherwise unremarkable. Appendix is not discretely seen. No CT evidence of appendicitis. Vascular/Lymphatic: No significant vascular findings are present. No enlarged abdominal or pelvic lymph nodes. Reproductive: Uterus is unremarkable. There is some fluid along the right adnexa that has Hounsfield units averaging 25. This could potentially reflect a small amount hemorrhage from a a ruptured ovarian cyst. There are no adnexal masses. Other: No abdominal wall hernia. Musculoskeletal: Unremarkable IMPRESSION: 1. No evidence of renal or ureteral stones. Kidneys, ureters and bladder are normal. 2. Small amount pelvic free fluid and extends along the right adnexa. Although this may be physiologic, somewhat increased density in the fluid suggests possible hemorrhage from a ruptured ovarian cyst. This could potentially be the source of pain. 3. No other abnormalities.  Normal appendix is not discretely seen. Electronically Signed   By: Lajean Manes M.D.   On: 03/12/2016 12:27    Assessment & Plan:   Dawn Porter was seen today for fall.  Diagnoses and all orders for this visit:  Concussion without loss of  consciousness, initial encounter  Fall (on) (from) other stairs and steps, initial encounter -     Discontinue: Ibuprofen-Famotidine (DUEXIS) 800-26.6 MG TABS; Take 1 tablet by mouth 3 (three) times daily with meals as needed. -     methocarbamol (ROBAXIN) 500 MG tablet; Take 1 tablet (500 mg total) by mouth at bedtime as needed for muscle spasms. -     Ibuprofen-Famotidine (DUEXIS) 800-26.6 MG TABS; Take 1 tablet by mouth 3 (three) times daily with meals as needed.  Myalgia, traumatic -     Discontinue: Ibuprofen-Famotidine (DUEXIS) 800-26.6 MG TABS; Take 1 tablet by mouth 3 (three) times daily with meals as needed. -     methocarbamol (ROBAXIN) 500 MG tablet; Take 1 tablet (500 mg total) by mouth at bedtime as needed for muscle spasms. -     Ibuprofen-Famotidine (DUEXIS) 800-26.6 MG TABS; Take 1 tablet by mouth 3 (three) times daily with meals as needed.   I have discontinued Ms. Bell Barsanti, lisdexamfetamine, doxycycline, HYDROcodone-acetaminophen, and amphetamine-dextroamphetamine. I am also having her start on methocarbamol. Additionally, I am having her maintain her multivitamin, fexofenadine, montelukast, fluticasone, and  Ibuprofen-Famotidine.  Meds ordered this encounter  Medications  . DISCONTD: Ibuprofen-Famotidine (DUEXIS) 800-26.6 MG TABS    Sig: Take 1 tablet by mouth 3 (three) times daily with meals as needed.    Dispense:  21 tablet    Refill:  0    Order Specific Question:   Supervising Provider    Answer:   Cassandria Anger [[id number]]  . methocarbamol (ROBAXIN) 500 MG tablet    Sig: Take 1 tablet (500 mg total) by mouth at bedtime as needed for muscle spasms.    Dispense:  14 tablet    Refill:  0    Order Specific Question:   Supervising Provider    Answer:   Cassandria Anger [[id number]]  . Ibuprofen-Famotidine (DUEXIS) 800-26.6 MG TABS    Sig: Take 1 tablet by mouth 3 (three) times daily with meals as needed.    Dispense:  21 tablet    Refill:  0    Order  Specific Question:   Supervising Provider    Answer:   Cassandria Anger [[id number]]    Follow-up: Return if symptoms worsen or fail to improve.  Wilfred Lacy, NP

## 2016-05-05 NOTE — Patient Instructions (Signed)
Head Injury, Adult There are many types of head injuries. They can be as minor as a bump. Some head injuries can be worse. Worse injuries include:  A strong hit to the head that hurts the brain (concussion).  A bruise of the brain (contusion). This means there is bleeding in the brain that can cause swelling.  A cracked skull (skull fracture).  Bleeding in the brain that gathers, gets thick (makes a clot), and forms a bump (hematoma).  Most problems from a head injury come in the first 24 hours. However, you may still have side effects up to 7-10 days after your injury. It is important to watch your condition for any changes. Follow these instructions at home: Activity  Rest as much as possible.  Avoid activities that are hard or tiring.  Make sure you get enough sleep.  Limit activities that need a lot of thought or attention, such as: ? Watching TV. ? Playing memory games and puzzles. ? Job-related work or homework. ? Working on the computer, social media, and texting.  Avoid activities that could cause another head injury until your doctor says it is okay. This includes playing sports.  Ask your doctor when it is safe for you to go back to your normal activities, such as work or school. Ask your doctor for a step-by-step plan for slowly going back to your normal activities.  Ask your doctor when you can drive, ride a bicycle, or use heavy machinery. Never do these activities if you are dizzy.  Lifestyle  Do not drink alcohol until your doctor says it is okay.  Avoid drug use.  If it is harder than usual to remember things, write them down.  If you are easily distracted, try to do one thing at a time.  Talk with family members or close friends when making important decisions.  Tell your friends, family, a trusted coworker, and work manager about your injury, symptoms, and limits (restrictions). Have them watch for any problems that are new or getting worse.  General  instructions  Take over-the-counter and prescription medicines only as told by your doctor.  Have someone stay with you for 24 hours after your head injury. This person should watch you for any changes in your symptoms and be ready to get help.  Keep all follow-up visits as told by your doctor. This is important.  Prevention  Work on your balance and strength. This can help you avoid falls.  Wear a seatbelt when you are in a moving vehicle.  Wear a helmet when: ? Riding a bicycle. ? Skiing. ? Doing any other sport or activity that has a risk of injury.  Drink alcohol only in moderation.  Make your home safer by: ? Getting rid of clutter from the floors and stairs, like things that can make you trip. ? Using grab bars in bathrooms and handrails by stairs. ? Placing non-slip mats on floors and in bathtubs. ? Putting more light in dim areas. Get help right away if:  You have: ? A very bad (severe) headache that is not helped by medicine. ? Trouble walking or weakness in your arms and legs. ? Clear or bloody fluid coming from your nose or ears. ? Changes in your seeing (vision). ? Jerky movements that you cannot control (seizure).  You throw up (vomit).  Your symptoms get worse.  You lose balance.  Your speech is slurred.  You pass out.  You are sleepier and have trouble staying awake.    The black centers of your eyes (pupils) change in size. These symptoms may be an emergency. Do not wait to see if the symptoms will go away. Get medical help right away. Call your local emergency services (911 in the [place] Do not drive yourself to the hospital. This information is not intended to replace advice given to you by your health care provider. Make sure you discuss any questions you have with your health care provider. Document Released: 05/14/2008 Document Revised: 12/26/2015 Document Reviewed: 12/10/2015 Elsevier Interactive Patient Education  2017 [employer]  

## 2016-06-02 ENCOUNTER — Telehealth: Payer: BLUE CROSS/BLUE SHIELD | Admitting: Nurse Practitioner

## 2016-06-02 DIAGNOSIS — B373 Candidiasis of vulva and vagina: Secondary | ICD-10-CM

## 2016-06-02 DIAGNOSIS — B3731 Acute candidiasis of vulva and vagina: Secondary | ICD-10-CM

## 2016-06-02 MED ORDER — FLUCONAZOLE 150 MG PO TABS: ORAL_TABLET | ORAL | 0 refills | Status: DC

## 2016-06-02 NOTE — Progress Notes (Signed)

## 2016-06-03 ENCOUNTER — Other Ambulatory Visit: Payer: Self-pay | Admitting: Nurse Practitioner

## 2016-06-03 DIAGNOSIS — W108XXA Fall (on) (from) other stairs and steps, initial encounter: Secondary | ICD-10-CM

## 2016-06-03 DIAGNOSIS — T148XXA Other injury of unspecified body region, initial encounter: Secondary | ICD-10-CM

## 2016-06-03 MED ORDER — FLUCONAZOLE 150 MG PO TABS: 150.0000 mg | ORAL_TABLET | Freq: Once | ORAL | 0 refills | Status: AC

## 2016-06-03 NOTE — Addendum Note (Signed)
Addended by: Evelina Dun A on: 06/03/2016 06:16 PM   Modules accepted: Orders

## 2016-06-04 MED ORDER — METHOCARBAMOL 500 MG PO TABS: 500.0000 mg | ORAL_TABLET | Freq: Every evening | ORAL | 0 refills | Status: DC | PRN

## 2016-06-12 ENCOUNTER — Other Ambulatory Visit: Payer: Self-pay | Admitting: Internal Medicine

## 2016-06-12 ENCOUNTER — Other Ambulatory Visit: Payer: Self-pay | Admitting: Nurse Practitioner

## 2016-06-12 ENCOUNTER — Telehealth: Payer: BLUE CROSS/BLUE SHIELD | Admitting: Family

## 2016-06-12 DIAGNOSIS — W108XXA Fall (on) (from) other stairs and steps, initial encounter: Secondary | ICD-10-CM

## 2016-06-12 DIAGNOSIS — M546 Pain in thoracic spine: Secondary | ICD-10-CM

## 2016-06-12 DIAGNOSIS — T148XXA Other injury of unspecified body region, initial encounter: Secondary | ICD-10-CM

## 2016-06-12 MED ORDER — NAPROXEN 500 MG PO TABS: 500.0000 mg | ORAL_TABLET | Freq: Two times a day (BID) | ORAL | 0 refills | Status: DC

## 2016-06-12 MED ORDER — BACLOFEN 10 MG PO TABS: 10.0000 mg | ORAL_TABLET | Freq: Three times a day (TID) | ORAL | 0 refills | Status: DC

## 2016-06-12 NOTE — Progress Notes (Signed)

## 2016-06-18 ENCOUNTER — Ambulatory Visit: Payer: BLUE CROSS/BLUE SHIELD | Admitting: Women's Health

## 2016-06-18 ENCOUNTER — Ambulatory Visit: Payer: BLUE CROSS/BLUE SHIELD | Admitting: Gynecology

## 2016-06-19 ENCOUNTER — Ambulatory Visit: Payer: BLUE CROSS/BLUE SHIELD | Admitting: Gynecology

## 2016-06-23 ENCOUNTER — Ambulatory Visit (INDEPENDENT_AMBULATORY_CARE_PROVIDER_SITE_OTHER): Payer: BLUE CROSS/BLUE SHIELD | Admitting: Gynecology

## 2016-06-23 ENCOUNTER — Encounter: Payer: Self-pay | Admitting: Gynecology

## 2016-06-23 VITALS — BP 102/68

## 2016-06-23 DIAGNOSIS — N898 Other specified noninflammatory disorders of vagina: Secondary | ICD-10-CM

## 2016-06-23 DIAGNOSIS — M545 Low back pain, unspecified: Secondary | ICD-10-CM

## 2016-06-23 LAB — URINALYSIS W MICROSCOPIC + REFLEX CULTURE
Bilirubin Urine: NEGATIVE
CASTS: NONE SEEN [LPF]
Crystals: NONE SEEN [HPF]
Glucose, UA: NEGATIVE
LEUKOCYTES UA: NEGATIVE
NITRITE: NEGATIVE
PH: 5.5 (ref 5.0–8.0)
Protein, ur: NEGATIVE
SPECIFIC GRAVITY, URINE: 1.025 (ref 1.001–1.035)
YEAST: NONE SEEN [HPF]

## 2016-06-23 LAB — WET PREP FOR TRICH, YEAST, CLUE
TRICH WET PREP: NONE SEEN
Yeast Wet Prep HPF POC: NONE SEEN

## 2016-06-23 MED ORDER — TINIDAZOLE 500 MG PO TABS: ORAL_TABLET | ORAL | 0 refills | Status: DC

## 2016-06-23 NOTE — Patient Instructions (Signed)
Bacterial Vaginosis Bacterial vaginosis is a vaginal infection that occurs when the normal balance of bacteria in the vagina is disrupted. It results from an overgrowth of certain bacteria. This is the most common vaginal infection among women ages 15-44. Because bacterial vaginosis increases your risk for STIs (sexually transmitted infections), getting treated can help reduce your risk for chlamydia, gonorrhea, herpes, and HIV (human immunodeficiency virus). Treatment is also important for preventing complications in pregnant women, because this condition can cause an early (premature) delivery. What are the causes? This condition is caused by an increase in harmful bacteria that are normally present in small amounts in the vagina. However, the reason that the condition develops is not fully understood. What increases the risk? The following factors may make you more likely to develop this condition:  Having a new sexual partner or multiple sexual partners.  Having unprotected sex.  Douching.  Having an intrauterine device (IUD).  Smoking.  Drug and alcohol abuse.  Taking certain antibiotic medicines.  Being pregnant.  You cannot get bacterial vaginosis from toilet seats, bedding, swimming pools, or contact with objects around you. What are the signs or symptoms? Symptoms of this condition include:  Grey or white vaginal discharge. The discharge can also be watery or foamy.  A fish-like odor with discharge, especially after sexual intercourse or during menstruation.  Itching in and around the vagina.  Burning or pain with urination.  Some women with bacterial vaginosis have no signs or symptoms. How is this diagnosed? This condition is diagnosed based on:  Your medical history.  A physical exam of the vagina.  Testing a sample of vaginal fluid under a microscope to look for a large amount of bad bacteria or abnormal cells. Your health care provider may use a cotton swab  or a small wooden spatula to collect the sample.  How is this treated? This condition is treated with antibiotics. These may be given as a pill, a vaginal cream, or a medicine that is put into the vagina (suppository). If the condition comes back after treatment, a second round of antibiotics may be needed. Follow these instructions at home: Medicines  Take over-the-counter and prescription medicines only as told by your health care provider.  Take or use your antibiotic as told by your health care provider. Do not stop taking or using the antibiotic even if you start to feel better. General instructions  If you have a female sexual partner, tell her that you have a vaginal infection. She should see her health care provider and be treated if she has symptoms. If you have a female sexual partner, he does not need treatment.  During treatment: ? Avoid sexual activity until you finish treatment. ? Do not douche. ? Avoid alcohol as directed by your health care provider. ? Avoid breastfeeding as directed by your health care provider.  Drink enough water and fluids to keep your urine clear or pale yellow.  Keep the area around your vagina and rectum clean. ? Wash the area daily with warm water. ? Wipe yourself from front to back after using the toilet.  Keep all follow-up visits as told by your health care provider. This is important. How is this prevented?  Do not douche.  Wash the outside of your vagina with warm water only.  Use protection when having sex. This includes latex condoms and dental dams.  Limit how many sexual partners you have. To help prevent bacterial vaginosis, it is best to have sex with just   one partner (monogamous).  Make sure you and your sexual partner are tested for STIs.  Wear cotton or cotton-lined underwear.  Avoid wearing tight pants and pantyhose, especially during summer.  Limit the amount of alcohol that you drink.  Do not use any products that  contain nicotine or tobacco, such as cigarettes and e-cigarettes. If you need help quitting, ask your health care provider.  Do not use illegal drugs. Where to find more information:  Centers for Disease Control and Prevention: www.cdc.gov/std  American Sexual Health Association (ASHA): www.ashastd.org  U.S. Department of Health and Human Services, Office on Women's Health: www.womenshealth.gov/ or https://www.womenshealth.gov/a-z-topics/bacterial-vaginosis Contact a health care provider if:  Your symptoms do not improve, even after treatment.  You have more discharge or pain when urinating.  You have a fever.  You have pain in your abdomen.  You have pain during sex.  You have vaginal bleeding between periods. Summary  Bacterial vaginosis is a vaginal infection that occurs when the normal balance of bacteria in the vagina is disrupted.  Because bacterial vaginosis increases your risk for STIs (sexually transmitted infections), getting treated can help reduce your risk for chlamydia, gonorrhea, herpes, and HIV (human immunodeficiency virus). Treatment is also important for preventing complications in pregnant women, because the condition can cause an early (premature) delivery.  This condition is treated with antibiotic medicines. These may be given as a pill, a vaginal cream, or a medicine that is put into the vagina (suppository). This information is not intended to replace advice given to you by your health care provider. Make sure you discuss any questions you have with your health care provider. Document Released: 06/01/2005 Document Revised: 02/15/2016 Document Reviewed: 02/15/2016 Elsevier Interactive Patient Education  2017 [employer] Inc.  

## 2016-06-23 NOTE — Progress Notes (Signed)
   Patient's a 26 year old that presented to the office today stating that a week ago she started experiencing some vulvar irritation and discharge and very little odor. She took a Diflucan but continues to have similar symptoms. Also she was and some mild low discomfort but no dysuria, frequency, fever, chills, or vomiting. She has not been sexually active in over a year having normal menstrual cycles otherwise.  Exam: Abdomen: Soft nontender no rebound or guarding Back: No CVA tenderness Pelvic: Bartholin urethra Skene was within normal limits Vagina questionable white clear discharge wet prep obtained Cervix: No gross lesions on inspection Bimanual exam not done  Urinalysis: 10-20 RBC, 6-10 squamous epithelial, many bacteria, moderate mucus, clue cells present  Wet prep: Clue cells present, many white blood cells, many bacteria  Assessment/plan: Patient with clinical evidence of bacterial vaginosis will be treated with Tindamax 500 mg tablet she will take for today and repeat in 24 hours. We'll also send her urine culture as well as.

## 2016-06-24 LAB — URINE CULTURE: ORGANISM ID, BACTERIA: NO GROWTH

## 2016-06-25 ENCOUNTER — Other Ambulatory Visit: Payer: Self-pay | Admitting: Gynecology

## 2016-06-25 DIAGNOSIS — R3129 Other microscopic hematuria: Secondary | ICD-10-CM

## 2016-07-08 ENCOUNTER — Telehealth: Payer: Self-pay | Admitting: Psychology

## 2016-07-08 DIAGNOSIS — F321 Major depressive disorder, single episode, moderate: Secondary | ICD-10-CM

## 2016-07-08 NOTE — Telephone Encounter (Signed)
Patient wants to come in and talk to you about starting depression medication please call [phone]

## 2016-07-09 NOTE — Telephone Encounter (Signed)
Called patient back, went to voicemail, voicemail box is full and I am unable to leave a message. Will attempt to call the patient back on Monday.

## 2016-07-13 NOTE — Progress Notes (Signed)
Spoke with patient today via phone. She would like a referral to a psychiatrist. Her depression is increasing and she is interested in trying another antidepressant. She would also like a referral for individual psychotherapy. I let her know I will place referrals to [facility] (for psychiatry) and [facility] (for therapy). She was agreeable with this plan.

## 2016-07-15 ENCOUNTER — Ambulatory Visit: Payer: BLUE CROSS/BLUE SHIELD | Admitting: Gynecology

## 2016-07-16 ENCOUNTER — Other Ambulatory Visit: Payer: BLUE CROSS/BLUE SHIELD

## 2016-07-16 DIAGNOSIS — R3129 Other microscopic hematuria: Secondary | ICD-10-CM

## 2016-07-16 LAB — URINALYSIS W MICROSCOPIC + REFLEX CULTURE
Bacteria, UA: NONE SEEN [HPF]
Bilirubin Urine: NEGATIVE
Casts: NONE SEEN [LPF]
Crystals: NONE SEEN [HPF]
Glucose, UA: NEGATIVE
HGB URINE DIPSTICK: NEGATIVE
Ketones, ur: NEGATIVE
LEUKOCYTES UA: NEGATIVE
NITRITE: NEGATIVE
PH: 7 (ref 5.0–8.0)
Protein, ur: NEGATIVE
SPECIFIC GRAVITY, URINE: 1.005 (ref 1.001–1.035)
Squamous Epithelial / LPF: NONE SEEN [HPF] (ref <=5)
WBC, UA: NONE SEEN WBC/HPF (ref <=5)
YEAST: NONE SEEN [HPF]

## 2016-07-17 LAB — URINE CULTURE: Organism ID, Bacteria: NO GROWTH

## 2016-07-28 ENCOUNTER — Ambulatory Visit (INDEPENDENT_AMBULATORY_CARE_PROVIDER_SITE_OTHER): Payer: BLUE CROSS/BLUE SHIELD | Admitting: Licensed Clinical Social Worker

## 2016-07-28 DIAGNOSIS — F331 Major depressive disorder, recurrent, moderate: Secondary | ICD-10-CM

## 2016-08-10 ENCOUNTER — Ambulatory Visit: Payer: Self-pay | Admitting: Licensed Clinical Social Worker

## 2016-08-12 ENCOUNTER — Telehealth: Payer: BLUE CROSS/BLUE SHIELD | Admitting: Family

## 2016-08-12 ENCOUNTER — Other Ambulatory Visit: Payer: Self-pay | Admitting: Internal Medicine

## 2016-08-12 DIAGNOSIS — W108XXA Fall (on) (from) other stairs and steps, initial encounter: Secondary | ICD-10-CM

## 2016-08-12 DIAGNOSIS — T148XXA Other injury of unspecified body region, initial encounter: Secondary | ICD-10-CM

## 2016-08-12 DIAGNOSIS — M546 Pain in thoracic spine: Secondary | ICD-10-CM

## 2016-08-12 DIAGNOSIS — R51 Headache: Secondary | ICD-10-CM

## 2016-08-12 DIAGNOSIS — R519 Headache, unspecified: Secondary | ICD-10-CM

## 2016-08-12 NOTE — Progress Notes (Signed)
Based on what you shared with me it looks like you have a serious condition that should be evaluated in a face to face office visit.  NOTE: Even if you have entered your credit card information for this eVisit, you will not be charged.   If you are having a true medical emergency please call 911.  If you need an urgent face to face visit, [facility] has four urgent care centers for your convenience.  If you need care fast and have a high deductible or no insurance consider:   [email or web address].instacarecheckin.[email or web address]  [phone]  [address], [address] [place], [place] [address] 8 am to 8 pm Monday-Friday 10 am to 4 pm Saturday-Sunday   The following sites will take your  insurance:    . [facility]  [phone] Get Driving Directions Find a Provider at this Location  [address] [place], [place] [address] . 10 am to 8 pm Monday-Friday . 12 pm to 8 pm Saturday-Sunday   . [facility]  [phone] Get Driving Directions Find a Provider at this Location  [address], [address] [place], [place] [address] . 8 am to 8 pm Monday-Friday . 9 am to 6 pm Saturday . 11 am to 6 pm Sunday   .  [facility] [place]  [phone] Get Driving Directions  [address] [place], [place] [address] . 8 am to 8 pm Monday-Friday . 8 am to 4 pm Saturday-Sunday   Your e-visit answers were reviewed by a board certified advanced clinical practitioner to complete your personal care plan.  Thank you for using e-Visits.  

## 2016-08-13 MED ORDER — METHOCARBAMOL 500 MG PO TABS: 500.0000 mg | ORAL_TABLET | Freq: Every evening | ORAL | 0 refills | Status: DC | PRN

## 2016-08-17 ENCOUNTER — Ambulatory Visit: Payer: BLUE CROSS/BLUE SHIELD | Admitting: Gynecology

## 2016-08-17 ENCOUNTER — Ambulatory Visit (INDEPENDENT_AMBULATORY_CARE_PROVIDER_SITE_OTHER): Payer: BLUE CROSS/BLUE SHIELD | Admitting: Licensed Clinical Social Worker

## 2016-08-17 DIAGNOSIS — F331 Major depressive disorder, recurrent, moderate: Secondary | ICD-10-CM | POA: Diagnosis not present

## 2016-08-25 ENCOUNTER — Ambulatory Visit (INDEPENDENT_AMBULATORY_CARE_PROVIDER_SITE_OTHER): Payer: BLUE CROSS/BLUE SHIELD | Admitting: Licensed Clinical Social Worker

## 2016-08-25 DIAGNOSIS — F331 Major depressive disorder, recurrent, moderate: Secondary | ICD-10-CM

## 2016-08-27 ENCOUNTER — Encounter: Payer: Self-pay | Admitting: Gynecology

## 2016-08-27 ENCOUNTER — Ambulatory Visit: Payer: BLUE CROSS/BLUE SHIELD | Admitting: Gynecology

## 2016-08-27 ENCOUNTER — Ambulatory Visit (INDEPENDENT_AMBULATORY_CARE_PROVIDER_SITE_OTHER): Payer: BLUE CROSS/BLUE SHIELD | Admitting: Gynecology

## 2016-08-27 VITALS — BP 110/73

## 2016-08-27 DIAGNOSIS — N760 Acute vaginitis: Secondary | ICD-10-CM | POA: Diagnosis not present

## 2016-08-27 DIAGNOSIS — Z113 Encounter for screening for infections with a predominantly sexual mode of transmission: Secondary | ICD-10-CM | POA: Diagnosis not present

## 2016-08-27 DIAGNOSIS — R1032 Left lower quadrant pain: Secondary | ICD-10-CM | POA: Insufficient documentation

## 2016-08-27 DIAGNOSIS — R110 Nausea: Secondary | ICD-10-CM

## 2016-08-27 DIAGNOSIS — B9689 Other specified bacterial agents as the cause of diseases classified elsewhere: Secondary | ICD-10-CM

## 2016-08-27 LAB — URINALYSIS W MICROSCOPIC + REFLEX CULTURE
Bilirubin Urine: NEGATIVE
Casts: NONE SEEN [LPF]
Crystals: NONE SEEN [HPF]
GLUCOSE, UA: NEGATIVE
LEUKOCYTES UA: NEGATIVE
Nitrite: NEGATIVE
PH: 6.5 (ref 5.0–8.0)
PROTEIN: NEGATIVE
Specific Gravity, Urine: 1.015 (ref 1.001–1.035)
Yeast: NONE SEEN [HPF]

## 2016-08-27 LAB — WET PREP FOR TRICH, YEAST, CLUE
Trich, Wet Prep: NONE SEEN
Yeast Wet Prep HPF POC: NONE SEEN

## 2016-08-27 LAB — PREGNANCY, URINE: Preg Test, Ur: NEGATIVE

## 2016-08-27 MED ORDER — KETOROLAC TROMETHAMINE 30 MG/ML IJ SOLN
30.0000 mg | Freq: Once | INTRAMUSCULAR | Status: AC
  Administered 2016-08-27: 30 mg via INTRAMUSCULAR

## 2016-08-27 MED ORDER — METRONIDAZOLE 500 MG PO TABS: 500.0000 mg | ORAL_TABLET | Freq: Two times a day (BID) | ORAL | 0 refills | Status: DC

## 2016-08-27 MED ORDER — KETOROLAC TROMETHAMINE 10 MG PO TABS: 10.0000 mg | ORAL_TABLET | Freq: Four times a day (QID) | ORAL | 0 refills | Status: DC | PRN

## 2016-08-27 NOTE — Progress Notes (Signed)
   Patient is a 26 year old that presented to the office today stating that after intercourse she had discomfort and during intercourse which was 4 days before her menstrual cycle. This last menstrual cycle lasted for 3 days she did a home pregnancy test was negative she had some nausea. We didn't a urine presented test in the office today which was negative. She has no GU or GI complaints and reports no vaginal discharge her symptoms are low abdominal discomfort more radiating to her left although the pain has been present for 3 weeks. She has been using condoms for contraception and recently became sexually active.  Exam: Gen. appearance patient appeared uncomfortable Back: No CVA tenderness Abdomen: Soft nontender no rebound or guarding Pelvic: Bartholin urethra Skene was within normal limits Vagina: Slight clear discharge no odor Cervix: No lesions or discharge Uterus: Anteverted normal size shape and consistency Adnexa: Right adnexa no masses or tenderness, left adnexal fullness and tenderness Rectal exam: Not examined  Urine pregnancy test negative Urinalysis moderate bacteria, 05 WBC, 0-2 RBC culture pending  Wet prep few clue cells, few white blood cell, too numerous to count bacteria  Assessment/plan: #1 bacterial vaginosis will be treated with Flagyl 500 mg twice a day for 7 days #2  3 week history of left lower quadrant discomfort fullness and tenderness noted on the left adnexa during exam patient will return tomorrow for ultrasound  Because of patient's discomfort and her returning back to class today she received Toradol 30 mg IM and a prescription for Toradol 10 mg to take 1 by mouth every 6 hours when necessary. A GC and Chlamydia culture was obtained as well. Urine culture pending.

## 2016-08-27 NOTE — Patient Instructions (Signed)
Bacterial Vaginosis Bacterial vaginosis is a vaginal infection that occurs when the normal balance of bacteria in the vagina is disrupted. It results from an overgrowth of certain bacteria. This is the most common vaginal infection among women ages 15-44. Because bacterial vaginosis increases your risk for STIs (sexually transmitted infections), getting treated can help reduce your risk for chlamydia, gonorrhea, herpes, and HIV (human immunodeficiency virus). Treatment is also important for preventing complications in pregnant women, because this condition can cause an early (premature) delivery. What are the causes? This condition is caused by an increase in harmful bacteria that are normally present in small amounts in the vagina. However, the reason that the condition develops is not fully understood. What increases the risk? The following factors may make you more likely to develop this condition:  Having a new sexual partner or multiple sexual partners.  Having unprotected sex.  Douching.  Having an intrauterine device (IUD).  Smoking.  Drug and alcohol abuse.  Taking certain antibiotic medicines.  Being pregnant.  You cannot get bacterial vaginosis from toilet seats, bedding, swimming pools, or contact with objects around you. What are the signs or symptoms? Symptoms of this condition include:  Grey or white vaginal discharge. The discharge can also be watery or foamy.  A fish-like odor with discharge, especially after sexual intercourse or during menstruation.  Itching in and around the vagina.  Burning or pain with urination.  Some women with bacterial vaginosis have no signs or symptoms. How is this diagnosed? This condition is diagnosed based on:  Your medical history.  A physical exam of the vagina.  Testing a sample of vaginal fluid under a microscope to look for a large amount of bad bacteria or abnormal cells. Your health care provider may use a cotton swab  or a small wooden spatula to collect the sample.  How is this treated? This condition is treated with antibiotics. These may be given as a pill, a vaginal cream, or a medicine that is put into the vagina (suppository). If the condition comes back after treatment, a second round of antibiotics may be needed. Follow these instructions at home: Medicines  Take over-the-counter and prescription medicines only as told by your health care provider.  Take or use your antibiotic as told by your health care provider. Do not stop taking or using the antibiotic even if you start to feel better. General instructions  If you have a female sexual partner, tell her that you have a vaginal infection. She should see her health care provider and be treated if she has symptoms. If you have a female sexual partner, he does not need treatment.  During treatment: ? Avoid sexual activity until you finish treatment. ? Do not douche. ? Avoid alcohol as directed by your health care provider. ? Avoid breastfeeding as directed by your health care provider.  Drink enough water and fluids to keep your urine clear or pale yellow.  Keep the area around your vagina and rectum clean. ? Wash the area daily with warm water. ? Wipe yourself from front to back after using the toilet.  Keep all follow-up visits as told by your health care provider. This is important. How is this prevented?  Do not douche.  Wash the outside of your vagina with warm water only.  Use protection when having sex. This includes latex condoms and dental dams.  Limit how many sexual partners you have. To help prevent bacterial vaginosis, it is best to have sex with just   one partner (monogamous).  Make sure you and your sexual partner are tested for STIs.  Wear cotton or cotton-lined underwear.  Avoid wearing tight pants and pantyhose, especially during summer.  Limit the amount of alcohol that you drink.  Do not use any products that  contain nicotine or tobacco, such as cigarettes and e-cigarettes. If you need help quitting, ask your health care provider.  Do not use illegal drugs. Where to find more information:  Centers for Disease Control and Prevention: www.cdc.gov/std  American Sexual Health Association (ASHA): www.ashastd.org  U.S. Department of Health and Human Services, Office on Women's Health: www.womenshealth.gov/ or https://www.womenshealth.gov/a-z-topics/bacterial-vaginosis Contact a health care provider if:  Your symptoms do not improve, even after treatment.  You have more discharge or pain when urinating.  You have a fever.  You have pain in your abdomen.  You have pain during sex.  You have vaginal bleeding between periods. Summary  Bacterial vaginosis is a vaginal infection that occurs when the normal balance of bacteria in the vagina is disrupted.  Because bacterial vaginosis increases your risk for STIs (sexually transmitted infections), getting treated can help reduce your risk for chlamydia, gonorrhea, herpes, and HIV (human immunodeficiency virus). Treatment is also important for preventing complications in pregnant women, because the condition can cause an early (premature) delivery.  This condition is treated with antibiotic medicines. These may be given as a pill, a vaginal cream, or a medicine that is put into the vagina (suppository). This information is not intended to replace advice given to you by your health care provider. Make sure you discuss any questions you have with your health care provider. Document Released: 06/01/2005 Document Revised: 02/15/2016 Document Reviewed: 02/15/2016 Elsevier Interactive Patient Education  2017 [employer] Inc.  

## 2016-08-28 LAB — URINE CULTURE: Organism ID, Bacteria: NO GROWTH

## 2016-08-28 LAB — GC/CHLAMYDIA PROBE AMP
CT PROBE, AMP APTIMA: NOT DETECTED
GC PROBE AMP APTIMA: NOT DETECTED

## 2016-08-31 ENCOUNTER — Other Ambulatory Visit: Payer: Self-pay | Admitting: Gynecology

## 2016-08-31 ENCOUNTER — Ambulatory Visit (INDEPENDENT_AMBULATORY_CARE_PROVIDER_SITE_OTHER): Payer: BLUE CROSS/BLUE SHIELD | Admitting: Gynecology

## 2016-08-31 ENCOUNTER — Ambulatory Visit (INDEPENDENT_AMBULATORY_CARE_PROVIDER_SITE_OTHER): Payer: BLUE CROSS/BLUE SHIELD

## 2016-08-31 ENCOUNTER — Encounter: Payer: Self-pay | Admitting: Gynecology

## 2016-08-31 VITALS — BP 118/74

## 2016-08-31 DIAGNOSIS — R1032 Left lower quadrant pain: Secondary | ICD-10-CM | POA: Diagnosis not present

## 2016-08-31 DIAGNOSIS — N83201 Unspecified ovarian cyst, right side: Secondary | ICD-10-CM

## 2016-08-31 DIAGNOSIS — R110 Nausea: Secondary | ICD-10-CM

## 2016-08-31 MED ORDER — MEDROXYPROGESTERONE ACETATE 150 MG/ML IM SUSP
150.0000 mg | Freq: Once | INTRAMUSCULAR | Status: AC
  Administered 2016-08-31: 150 mg via INTRAMUSCULAR

## 2016-08-31 NOTE — Progress Notes (Signed)
   Patient is a 26 year old who was seen in the office 4 days ago complaining of pain after intercourse which had occurred 4 days before the start of her menses. She is using condoms for contraception she had a home pregnancy test which was negative as well as a urine pregnancy test here in the office which was negative. She was mostly tender on her left lower quadrant and it was a questionable fullness in the area so she was instructed to return today for a follow-up ultrasound. During that office visit she was found to have bacterial vaginosis and was treated with Flagyl 500 mg twice a day for 7 days. During that office visit she had a negative GC and Chlamydia culture. And her urine culture was negative.  Patient in no distress today. Patient states that her symptoms are improving this does still there present but not like before.  Ultrasound today: Uterus measured 8.4 x 5.8 x 2.7 cm a right thinwall echo-free cyst measuring 31 x 24 x 34 mm was noted negative color flow. Left ovary was normal. Some fluid was noted in the cul-de-sac measuring 34 x 19 mm.  Assessment/plan: It appears patient's symptomatology. Then attributed to a collapsing ovarian cyst. She will receive Depo-Provera 150 mg IM and returned back in 3 months for follow-up ultrasound. Time of consultation 10 minutes.

## 2016-08-31 NOTE — Patient Instructions (Signed)
Ovarian Cyst  An ovarian cyst is a fluid-filled sac that forms on an ovary. The ovaries are small organs that produce eggs in women. Various types of cysts can form on the ovaries. Some may cause symptoms and require treatment. Most ovarian cysts go away on their own, are not cancerous (are benign), and do not cause problems. Common types of ovarian cysts include:  Functional (follicle) cysts.  Occur during the menstrual cycle, and usually go away with the next menstrual cycle if you do not get pregnant.  Usually cause no symptoms.  Endometriomas.  Are cysts that form from the tissue that lines the uterus (endometrium).  Are sometimes called "chocolate cysts" because they become filled with blood that turns brown.  Can cause pain in the lower abdomen during intercourse and during your period.  Cystadenoma cysts.  Develop from cells on the outside surface of the ovary.  Can get very large and cause lower abdomen pain and pain with intercourse.  Can cause severe pain if they twist or break open (rupture).  Dermoid cysts.  Are sometimes found in both ovaries.  May contain different kinds of body tissue, such as skin, teeth, hair, or cartilage.  Usually do not cause symptoms unless they get very big.  Theca lutein cysts.  Occur when too much of a certain hormone (human chorionic gonadotropin) is produced and overstimulates the ovaries to produce an egg.  Are most common after having procedures used to assist with the conception of a baby (in vitro fertilization). What are the causes? Ovarian cysts may be caused by:  Ovarian hyperstimulation syndrome. This is a condition that can develop from taking fertility medicines. It causes multiple large ovarian cysts to form.  Polycystic ovarian syndrome (PCOS). This is a common hormonal disorder that can cause ovarian cysts, as well as problems with your period or fertility. What increases the risk? The following factors may make you  more likely to develop ovarian cysts:  Being overweight or obese.  Taking fertility medicines.  Taking certain forms of hormonal birth control.  Smoking. What are the signs or symptoms? Many ovarian cysts do not cause symptoms. If symptoms are present, they may include:  Pelvic pain or pressure.  Pain in the lower abdomen.  Pain during sex.  Abdominal swelling.  Abnormal menstrual periods.  Increasing pain with menstrual periods. How is this diagnosed? These cysts are commonly found during a routine pelvic exam. You may have tests to find out more about the cyst, such as:  Ultrasound.  X-ray of the pelvis.  CT scan.  MRI.  Blood tests. How is this treated? Many ovarian cysts go away on their own without treatment. Your health care provider may want to check your cyst regularly for 2-3 months to see if it changes. If you are in menopause, it is especially important to have your cyst monitored closely because menopausal women have a higher rate of ovarian cancer. When treatment is needed, it may include:  Medicines to help relieve pain.  A procedure to drain the cyst (aspiration).  Surgery to remove the whole cyst.  Hormone treatment or birth control pills. These methods are sometimes used to help dissolve a cyst. Follow these instructions at home:  Take over-the-counter and prescription medicines only as told by your health care provider.  Do not drive or use heavy machinery while taking prescription pain medicine.  Get regular pelvic exams and Pap tests as often as told by your health care provider.  Return to your   normal activities as told by your health care provider. Ask your health care provider what activities are safe for you.  Do not use any products that contain nicotine or tobacco, such as cigarettes and e-cigarettes. If you need help quitting, ask your health care provider.  Keep all follow-up visits as told by your health care provider. This is  important. Contact a health care provider if:  Your periods are late, irregular, or painful, or they stop.  You have pelvic pain that does not go away.  You have pressure on your bladder or trouble emptying your bladder completely.  You have pain during sex.  You have any of the following in your abdomen:  A feeling of fullness.  Pressure.  Discomfort.  Pain that does not go away.  Swelling.  You feel generally ill.  You become constipated.  You lose your appetite.  You develop severe acne.  You start to have more body hair and facial hair.  You are gaining weight or losing weight without changing your exercise and eating habits.  You think you may be pregnant. Get help right away if:  You have abdominal pain that is severe or gets worse.  You cannot eat or drink without vomiting.  You suddenly develop a fever.  Your menstrual period is much heavier than usual. This information is not intended to replace advice given to you by your health care provider. Make sure you discuss any questions you have with your health care provider. Document Released: 06/01/2005 Document Revised: 12/20/2015 Document Reviewed: 11/03/2015 Elsevier Interactive Patient Education  2017 [employer] Inc.  

## 2016-08-31 NOTE — Addendum Note (Signed)
Addended by: Nelva Nay on: 08/31/2016 12:05 PM   Modules accepted: Orders

## 2016-09-01 ENCOUNTER — Encounter (HOSPITAL_COMMUNITY): Payer: Self-pay | Admitting: Psychiatry

## 2016-09-01 ENCOUNTER — Ambulatory Visit (INDEPENDENT_AMBULATORY_CARE_PROVIDER_SITE_OTHER): Payer: BLUE CROSS/BLUE SHIELD | Admitting: Psychiatry

## 2016-09-01 DIAGNOSIS — Z79899 Other long term (current) drug therapy: Secondary | ICD-10-CM | POA: Diagnosis not present

## 2016-09-01 DIAGNOSIS — F4312 Post-traumatic stress disorder, chronic: Secondary | ICD-10-CM | POA: Diagnosis not present

## 2016-09-01 DIAGNOSIS — Z87891 Personal history of nicotine dependence: Secondary | ICD-10-CM

## 2016-09-01 MED ORDER — PRAZOSIN HCL 2 MG PO CAPS: 2.0000 mg | ORAL_CAPSULE | Freq: Every day | ORAL | 0 refills | Status: DC

## 2016-09-01 MED ORDER — FLUOXETINE HCL 10 MG PO CAPS: 10.0000 mg | ORAL_CAPSULE | Freq: Every day | ORAL | 0 refills | Status: DC
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
## 2016-09-01 NOTE — Patient Instructions (Signed)
Start Prozac 10 mg daily, then increase to 20 mg in 1 week  Start prazosin 2 mg at around 10 pm Increase Prazosin to 4 mg in 1 week  Come back in 4-5 weeks   Posttraumatic Stress Disorder Posttraumatic stress disorder (PTSD) is a mental health disorder that can occur after a traumatic event, such as a threat to life, serious injury, or sexual violence. Some people who experience these types of events may develop PTSD. Sometimes, PTSD can occur in people who hear about trauma that occurs to a close family member or friend. PTSD can happen to anyone at any age. What increases the risk? This condition is more likely to occur in:  Engineer, manufacturing.  People who have been the victims of, or witness to, a traumatic event, such as:  Domestic violence.  Childhood physical or sexual abuse.  Rape.  Natural disasters.  Accidents involving serious injury. What are the signs or symptoms? Symptoms of PTSD can be grouped into several categories: intrusive, avoidance, increased arousal, and negative moods and thoughts. Intrusive Symptoms  This is when a person re-experiences the traumatic event through one or more of the following ways:  Distressing dreams.  Feelings of fear, horror, intense sadness, or anger in response to a reminder of the trauma.  Unwanted distressing memories while awake.  Physical reactions triggered by reminders of the trauma, such as increased heart rate, shortness of breath, sweating, and shaking.  Having flashbacks, or feelings like you are going through the event again. Avoidance Symptoms  This is when a person avoids thoughts, conversations, people, or activities that are reminders of the trauma. Symptoms may also include:  Decreased interest or participation in daily activities.  Loss of connection or avoidance of other people. Increased Arousal Symptoms  This is when a person is more sensitive or reacts more easily to their environment.  Symptoms may include:  Being easily startled.  Careless or self-destructive behavior.  Irritability.  Feeling on edge.  Difficulty concentrating.  Verbal or physical outbursts of anger toward other people or objects.  Difficulty sleeping. Negative Moods or Thoughts  These may include:  Belief that oneself or others are bad.  Regular feelings of fear, horror, anger, sadness, guilt, or shame.  Not being able to remember certain parts of the traumatic event.  Blaming themselves or others for the trauma.  Inability to experience positive emotions, such as happiness or love. PTSD symptoms may start soon after a frightening event or months or years later. Symptoms last at least one month and tend to disrupt relationships, work, and daily activities. How is this diagnosed? PTSD is diagnosed through an assessment by a mental health professional. Dawn Porter will be asked questions about any traumatic events. You will also be asked about how these events have changed your thoughts, mood, behavior, and ability to function on a daily basis. You may also be asked if you use alcohol or drugs. How is this treated? Treatment for PTSD may include:  Medicines. Certain medicines can reduce some PTSD symptoms.  Counseling (cognitive behavioral therapy). Talk therapy with a mental health professional who is experienced in treating PTSD can help.  Eye movement desensitization and reprocessing therapy (EMDR). This type of therapy occurs with a specialized therapist. Many people with PTSD benefit from a combination of these treatments. If you have other mental health problems, such as depression, alcohol abuse, or drug addiction, your treatment plan will include treatment for these other conditions. Follow these instructions at home: Lifestyle  Find a support group in your community. Often groups are available for [employer] veterans, trauma victims, and family members or caregivers.  Find ways to relax.  This may include:  Breathing exercises.  Meditation.  Yoga.  Listening to quiet music.  Exercise regularly. Try to do at least 30 minutes of physical activity most days of the week.  Try to get 7-9 hours of sleep each night. To help with sleep:  Keep your bedroom cool and dark.  Do not eat a heavy meal within one hour of bedtime.  Do not drink alcohol or caffeinated drinks before bed.  Avoid screen time, such as television, computers, tablets, or cell phones before bed.  Do not drink alcohol or take illegal drugs.  Look into volunteer opportunities. This can help you feel more connected to your community.  Take steps to help yourself feel safer at home, such as by installing a security system.  Contact a local organization to find out if you are eligible for a service dog.  Keep daily contact with at least one trusted friend or family member.  If your PTSD is affecting your marriage or family, seek help from a family therapist. General instructions   Take over-the-counter and prescription medicines only as told by your health care provider.  Keep all follow-up visits as told by your health care provider and counselor. This is important.  Make sure to let all of your health care providers know you have PTSD. This is especially important if you are having surgery or need to be admitted to the hospital. Contact a health care provider if:  Your symptoms do not get better or get worse. Get help right away if:  You have thoughts of wanting to harm yourself or others. This information is not intended to replace advice given to you by your health care provider. Make sure you discuss any questions you have with your health care provider. Document Released: 02/24/2001 Document Revised: 09/23/2015 Document Reviewed: 05/17/2015 Elsevier Interactive Patient Education  2017 [employer].

## 2016-09-02 ENCOUNTER — Other Ambulatory Visit: Payer: Self-pay | Admitting: *Deleted

## 2016-09-02 DIAGNOSIS — N83201 Unspecified ovarian cyst, right side: Secondary | ICD-10-CM

## 2016-09-10 ENCOUNTER — Other Ambulatory Visit: Payer: Self-pay

## 2016-09-10 ENCOUNTER — Encounter: Payer: Self-pay | Admitting: Gynecology

## 2016-09-10 MED ORDER — METRONIDAZOLE 500 MG PO TABS: 500.0000 mg | ORAL_TABLET | Freq: Two times a day (BID) | ORAL | 0 refills | Status: DC

## 2016-09-14 ENCOUNTER — Ambulatory Visit (INDEPENDENT_AMBULATORY_CARE_PROVIDER_SITE_OTHER): Payer: BLUE CROSS/BLUE SHIELD | Admitting: Licensed Clinical Social Worker

## 2016-09-14 DIAGNOSIS — F3341 Major depressive disorder, recurrent, in partial remission: Secondary | ICD-10-CM | POA: Diagnosis not present

## 2016-09-21 ENCOUNTER — Telehealth: Payer: BLUE CROSS/BLUE SHIELD | Admitting: Family

## 2016-09-21 DIAGNOSIS — N390 Urinary tract infection, site not specified: Secondary | ICD-10-CM

## 2016-09-21 DIAGNOSIS — R319 Hematuria, unspecified: Secondary | ICD-10-CM

## 2016-09-21 MED ORDER — NITROFURANTOIN MONOHYD MACRO 100 MG PO CAPS
100.0000 mg | ORAL_CAPSULE | Freq: Two times a day (BID) | ORAL | 0 refills | Status: DC
Start: 2016-09-21 — End: 2016-11-05

## 2016-09-21 NOTE — Progress Notes (Signed)

## 2016-09-29 ENCOUNTER — Ambulatory Visit (INDEPENDENT_AMBULATORY_CARE_PROVIDER_SITE_OTHER): Payer: BLUE CROSS/BLUE SHIELD | Admitting: Licensed Clinical Social Worker

## 2016-09-29 DIAGNOSIS — F3341 Major depressive disorder, recurrent, in partial remission: Secondary | ICD-10-CM

## 2016-10-06 ENCOUNTER — Encounter (HOSPITAL_COMMUNITY): Payer: Self-pay | Admitting: Psychiatry

## 2016-10-06 ENCOUNTER — Ambulatory Visit (INDEPENDENT_AMBULATORY_CARE_PROVIDER_SITE_OTHER): Payer: BLUE CROSS/BLUE SHIELD | Admitting: Psychiatry

## 2016-10-06 DIAGNOSIS — Z87891 Personal history of nicotine dependence: Secondary | ICD-10-CM | POA: Diagnosis not present

## 2016-10-06 DIAGNOSIS — F4312 Post-traumatic stress disorder, chronic: Secondary | ICD-10-CM

## 2016-10-06 DIAGNOSIS — Z79899 Other long term (current) drug therapy: Secondary | ICD-10-CM | POA: Diagnosis not present

## 2016-10-06 MED ORDER — PRAZOSIN HCL 2 MG PO CAPS: 4.0000 mg | ORAL_CAPSULE | Freq: Every day | ORAL | 0 refills | Status: DC

## 2016-10-06 MED ORDER — FLUOXETINE HCL 20 MG PO CAPS: 20.0000 mg | ORAL_CAPSULE | Freq: Every day | ORAL | 0 refills | Status: DC
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
## 2016-10-15 ENCOUNTER — Ambulatory Visit: Payer: BLUE CROSS/BLUE SHIELD | Admitting: Licensed Clinical Social Worker

## 2016-10-15 ENCOUNTER — Ambulatory Visit (INDEPENDENT_AMBULATORY_CARE_PROVIDER_SITE_OTHER): Payer: BLUE CROSS/BLUE SHIELD | Admitting: Licensed Clinical Social Worker

## 2016-10-15 DIAGNOSIS — F3341 Major depressive disorder, recurrent, in partial remission: Secondary | ICD-10-CM | POA: Diagnosis not present

## 2016-10-17 ENCOUNTER — Other Ambulatory Visit: Payer: Self-pay | Admitting: Internal Medicine

## 2016-10-17 ENCOUNTER — Encounter (HOSPITAL_COMMUNITY): Payer: Self-pay | Admitting: Psychiatry

## 2016-10-17 DIAGNOSIS — W108XXA Fall (on) (from) other stairs and steps, initial encounter: Secondary | ICD-10-CM

## 2016-10-17 DIAGNOSIS — T148XXA Other injury of unspecified body region, initial encounter: Secondary | ICD-10-CM

## 2016-10-17 DIAGNOSIS — F5104 Psychophysiologic insomnia: Secondary | ICD-10-CM

## 2016-10-18 ENCOUNTER — Encounter (HOSPITAL_COMMUNITY): Payer: Self-pay | Admitting: Psychiatry

## 2016-10-18 MED ORDER — HYDROXYZINE PAMOATE 25 MG PO CAPS: 25.0000 mg | ORAL_CAPSULE | Freq: Every evening | ORAL | 1 refills | Status: DC | PRN

## 2016-10-19 MED ORDER — METHOCARBAMOL 500 MG PO TABS: 500.0000 mg | ORAL_TABLET | Freq: Every evening | ORAL | 0 refills | Status: DC | PRN

## 2016-10-22 ENCOUNTER — Encounter (HOSPITAL_COMMUNITY): Payer: Self-pay | Admitting: Psychiatry

## 2016-10-22 DIAGNOSIS — M5383 Other specified dorsopathies, cervicothoracic region: Secondary | ICD-10-CM | POA: Diagnosis not present

## 2016-10-22 DIAGNOSIS — M25551 Pain in right hip: Secondary | ICD-10-CM | POA: Diagnosis not present

## 2016-10-22 DIAGNOSIS — M9901 Segmental and somatic dysfunction of cervical region: Secondary | ICD-10-CM | POA: Diagnosis not present

## 2016-10-22 DIAGNOSIS — M9905 Segmental and somatic dysfunction of pelvic region: Secondary | ICD-10-CM | POA: Diagnosis not present

## 2016-10-26 DIAGNOSIS — M9905 Segmental and somatic dysfunction of pelvic region: Secondary | ICD-10-CM | POA: Diagnosis not present

## 2016-10-26 DIAGNOSIS — M9901 Segmental and somatic dysfunction of cervical region: Secondary | ICD-10-CM | POA: Diagnosis not present

## 2016-10-26 DIAGNOSIS — M5383 Other specified dorsopathies, cervicothoracic region: Secondary | ICD-10-CM | POA: Diagnosis not present

## 2016-10-26 DIAGNOSIS — M25551 Pain in right hip: Secondary | ICD-10-CM | POA: Diagnosis not present

## 2016-10-28 ENCOUNTER — Encounter: Payer: Self-pay | Admitting: Gynecology

## 2016-10-29 DIAGNOSIS — M9901 Segmental and somatic dysfunction of cervical region: Secondary | ICD-10-CM | POA: Diagnosis not present

## 2016-10-29 DIAGNOSIS — M9905 Segmental and somatic dysfunction of pelvic region: Secondary | ICD-10-CM | POA: Diagnosis not present

## 2016-10-29 DIAGNOSIS — M25551 Pain in right hip: Secondary | ICD-10-CM | POA: Diagnosis not present

## 2016-10-29 DIAGNOSIS — M5383 Other specified dorsopathies, cervicothoracic region: Secondary | ICD-10-CM | POA: Diagnosis not present

## 2016-11-01 ENCOUNTER — Encounter (HOSPITAL_COMMUNITY): Payer: Self-pay | Admitting: Psychiatry

## 2016-11-02 ENCOUNTER — Other Ambulatory Visit (HOSPITAL_COMMUNITY): Payer: Self-pay | Admitting: Psychiatry

## 2016-11-02 DIAGNOSIS — F5104 Psychophysiologic insomnia: Secondary | ICD-10-CM

## 2016-11-02 DIAGNOSIS — F4312 Post-traumatic stress disorder, chronic: Secondary | ICD-10-CM

## 2016-11-02 MED ORDER — HYDROXYZINE HCL 10 MG PO TABS: 10.0000 mg | ORAL_TABLET | Freq: Every evening | ORAL | 0 refills | Status: DC | PRN

## 2016-11-02 MED ORDER — FLUOXETINE HCL 40 MG PO CAPS
40.0000 mg | ORAL_CAPSULE | Freq: Every day | ORAL | 0 refills | Status: DC
Start: 2016-11-02 — End: 2017-02-11

## 2016-11-03 DIAGNOSIS — M9901 Segmental and somatic dysfunction of cervical region: Secondary | ICD-10-CM | POA: Diagnosis not present

## 2016-11-03 DIAGNOSIS — M5383 Other specified dorsopathies, cervicothoracic region: Secondary | ICD-10-CM | POA: Diagnosis not present

## 2016-11-03 DIAGNOSIS — M25551 Pain in right hip: Secondary | ICD-10-CM | POA: Diagnosis not present

## 2016-11-03 DIAGNOSIS — M9905 Segmental and somatic dysfunction of pelvic region: Secondary | ICD-10-CM | POA: Diagnosis not present

## 2016-11-04 DIAGNOSIS — M9901 Segmental and somatic dysfunction of cervical region: Secondary | ICD-10-CM | POA: Diagnosis not present

## 2016-11-04 DIAGNOSIS — M25551 Pain in right hip: Secondary | ICD-10-CM | POA: Diagnosis not present

## 2016-11-04 DIAGNOSIS — M5383 Other specified dorsopathies, cervicothoracic region: Secondary | ICD-10-CM | POA: Diagnosis not present

## 2016-11-04 DIAGNOSIS — M9905 Segmental and somatic dysfunction of pelvic region: Secondary | ICD-10-CM | POA: Diagnosis not present

## 2016-11-05 ENCOUNTER — Ambulatory Visit (INDEPENDENT_AMBULATORY_CARE_PROVIDER_SITE_OTHER): Payer: BLUE CROSS/BLUE SHIELD | Admitting: Podiatry

## 2016-11-05 ENCOUNTER — Ambulatory Visit (INDEPENDENT_AMBULATORY_CARE_PROVIDER_SITE_OTHER): Payer: BLUE CROSS/BLUE SHIELD

## 2016-11-05 ENCOUNTER — Other Ambulatory Visit: Payer: Self-pay | Admitting: Podiatry

## 2016-11-05 ENCOUNTER — Encounter: Payer: Self-pay | Admitting: Podiatry

## 2016-11-05 DIAGNOSIS — M7752 Other enthesopathy of left foot: Secondary | ICD-10-CM

## 2016-11-05 DIAGNOSIS — M778 Other enthesopathies, not elsewhere classified: Secondary | ICD-10-CM

## 2016-11-05 DIAGNOSIS — M79672 Pain in left foot: Secondary | ICD-10-CM

## 2016-11-05 DIAGNOSIS — M779 Enthesopathy, unspecified: Secondary | ICD-10-CM

## 2016-11-05 MED ORDER — TRIAMCINOLONE ACETONIDE 10 MG/ML IJ SUSP
10.0000 mg | Freq: Once | INTRAMUSCULAR | Status: AC
  Administered 2016-11-05: 10 mg

## 2016-11-05 NOTE — Progress Notes (Signed)
   Subjective:    Patient ID: Dawn Porter, female    DOB: 1990-07-13, 26 y.o.   MRN: [id number]  HPI Chief Complaint  Patient presents with  . Foot Pain    Forefoot left - aching for 2-3 years intermittently, saw doc in [place] 3 years ago-had MRI to confirm, treated with injections, rest, orthotics with met pad (she still wears), and also k-laser treatment with a chiropractor       Review of Systems  Constitutional: Positive for activity change and unexpected weight change.  Musculoskeletal: Positive for arthralgias, back pain, gait problem and myalgias.  Neurological: Positive for headaches.  Psychiatric/Behavioral: Positive for behavioral problems.  All other systems reviewed and are negative.      Objective:   Physical Exam        Assessment & Plan:

## 2016-11-07 NOTE — Progress Notes (Signed)
Subjective:    Patient ID: Dawn Porter, female   DOB: 26 y.o.   MRN: [id number]   HPI patient presents with mother with possible diagnosis of neuroma left which is been going on for several years and she's had orthotics which gave her minimal relief and has had previous MRI and was treated with injection which only gave temporary relief    Review of Systems  All other systems reviewed and are negative.       Objective:  Physical Exam  Constitutional: She is oriented to person, place, and time.  Cardiovascular: Intact distal pulses.   Musculoskeletal: Normal range of motion.  Neurological: She is alert and oriented to person, place, and time.  Skin: Skin is warm.  Nursing note and vitals reviewed.  neurovascular status intact muscle strength adequate with exquisite discomfort mostly located to the third metatarsophalangeal joint with inability to palpate a Biagio Borg sign or pain of a significant nature in the second or third interspace. Found have good digital perfusion well oriented 3     Assessment:    Reasonable chance we may be dealing with inflammatory capsulitis versus the possibility of neuroma which is present     Plan:    H&P all conditions reviewed and today I did a proximal nerve block left aspirated the third MPJ got out a small amount of clear fluid and injected quarter cc of dexamethasone Kenalog and applied thick metatarsal pad. I want to reevaluate her again in several weeks and we can decide what type orthotic to be of benefit and ultimately could require shortening osteotomy  X-rays do not indicate arthritis stress fracture or other significant pathology

## 2016-11-13 ENCOUNTER — Ambulatory Visit: Payer: Self-pay | Admitting: Licensed Clinical Social Worker

## 2016-11-17 DIAGNOSIS — M9905 Segmental and somatic dysfunction of pelvic region: Secondary | ICD-10-CM | POA: Diagnosis not present

## 2016-11-17 DIAGNOSIS — M25551 Pain in right hip: Secondary | ICD-10-CM | POA: Diagnosis not present

## 2016-11-17 DIAGNOSIS — M5383 Other specified dorsopathies, cervicothoracic region: Secondary | ICD-10-CM | POA: Diagnosis not present

## 2016-11-17 DIAGNOSIS — M9901 Segmental and somatic dysfunction of cervical region: Secondary | ICD-10-CM | POA: Diagnosis not present

## 2016-11-19 ENCOUNTER — Ambulatory Visit: Payer: BLUE CROSS/BLUE SHIELD | Admitting: Licensed Clinical Social Worker

## 2016-11-20 DIAGNOSIS — M25551 Pain in right hip: Secondary | ICD-10-CM | POA: Diagnosis not present

## 2016-11-20 DIAGNOSIS — M5383 Other specified dorsopathies, cervicothoracic region: Secondary | ICD-10-CM | POA: Diagnosis not present

## 2016-11-20 DIAGNOSIS — M9905 Segmental and somatic dysfunction of pelvic region: Secondary | ICD-10-CM | POA: Diagnosis not present

## 2016-11-20 DIAGNOSIS — M9901 Segmental and somatic dysfunction of cervical region: Secondary | ICD-10-CM | POA: Diagnosis not present

## 2016-11-23 ENCOUNTER — Other Ambulatory Visit: Payer: Self-pay | Admitting: Internal Medicine

## 2016-11-23 DIAGNOSIS — W108XXA Fall (on) (from) other stairs and steps, initial encounter: Secondary | ICD-10-CM

## 2016-11-23 DIAGNOSIS — T148XXA Other injury of unspecified body region, initial encounter: Secondary | ICD-10-CM

## 2016-11-25 ENCOUNTER — Ambulatory Visit (INDEPENDENT_AMBULATORY_CARE_PROVIDER_SITE_OTHER): Payer: BLUE CROSS/BLUE SHIELD | Admitting: Gynecology

## 2016-11-25 ENCOUNTER — Ambulatory Visit (INDEPENDENT_AMBULATORY_CARE_PROVIDER_SITE_OTHER): Payer: BLUE CROSS/BLUE SHIELD

## 2016-11-25 DIAGNOSIS — Z8742 Personal history of other diseases of the female genital tract: Secondary | ICD-10-CM

## 2016-11-25 DIAGNOSIS — N83201 Unspecified ovarian cyst, right side: Secondary | ICD-10-CM

## 2016-11-25 NOTE — Progress Notes (Signed)
   Patient is a 26 year old that presented to the office for 3 month follow-up as a result of history of right ovarian cyst. Patient was started on Depo-Provera 150 mg IM to suppress the cyst that she's here for follow-up ultrasound. Previous ultrasound done 08/31/2016 demonstrated the following:  Uterus measured 8.4 x 5.8 x 2.7 cm a right thinwall echo-free cyst measuring 31 x 24 x 34 mm was noted negative color flow. Left ovary was normal. Some fluid was noted in the cul-de-sac measuring 34 x 19 mm   Ultrasound today: Uterus measures 6.5 x 3.9 x 2.8 cm with endometrial stripe of 3 mm. Right ovary previous cyst not seen numerous follicles seen on left ovary no fluid in the cul-de-sac.  Complete resolution of previous exceeding right ovarian cyst as a result of low abdominal discomfort. Patient has worn off the effects of the Depo-Provera injection she was instructed to use some form of. Contraception although she states that right now she is celibate. She is scheduled for annual exam the end of August for beginning in September this year.

## 2016-11-26 ENCOUNTER — Encounter: Payer: Self-pay | Admitting: Podiatry

## 2016-11-26 ENCOUNTER — Ambulatory Visit (INDEPENDENT_AMBULATORY_CARE_PROVIDER_SITE_OTHER): Payer: BLUE CROSS/BLUE SHIELD | Admitting: Podiatry

## 2016-11-26 DIAGNOSIS — D3610 Benign neoplasm of peripheral nerves and autonomic nervous system, unspecified: Secondary | ICD-10-CM

## 2016-11-26 DIAGNOSIS — M778 Other enthesopathies, not elsewhere classified: Secondary | ICD-10-CM

## 2016-11-26 DIAGNOSIS — M7752 Other enthesopathy of left foot: Secondary | ICD-10-CM

## 2016-11-26 DIAGNOSIS — M779 Enthesopathy, unspecified: Principal | ICD-10-CM

## 2016-11-26 NOTE — Progress Notes (Signed)
Subjective:    Patient ID: Dawn Porter, female   DOB: 26 y.o.   MRN: [id number]   HPI patient presents stating I'm still getting pain in that area and I think maybe it helped me some but it still quite sore    ROS      Objective:  Physical Exam neurovascular status intact with patient noted to have continued discomfort in the third metatarsophalangeal joint left and into the second interspace left with radiating-like discomfort. The pain is more prevalent in the third metatarsophalangeal joint     Assessment:    Appears to be inflammatory capsulitis of the third MPJ left with fluid buildup with possibility for neuroma symptoms     Plan:     H&P conditions reviewed and at this point I recommended long-term orthotics to try to reduce stress against the joint surface I discussed customized orthotic devices and patient will be seen by Liliane Channel 4 device to offload the third metatarsal left and metatarsal pads and also consider trying to offload second interspace left which may be difficult to do along with the third metatarsal. Patient to be evaluated by Liliane Channel

## 2016-11-30 ENCOUNTER — Ambulatory Visit: Payer: BLUE CROSS/BLUE SHIELD | Admitting: Orthotics

## 2016-12-02 ENCOUNTER — Ambulatory Visit (INDEPENDENT_AMBULATORY_CARE_PROVIDER_SITE_OTHER): Payer: BLUE CROSS/BLUE SHIELD | Admitting: Orthopedic Surgery

## 2016-12-02 ENCOUNTER — Encounter (INDEPENDENT_AMBULATORY_CARE_PROVIDER_SITE_OTHER): Payer: Self-pay | Admitting: Orthopedic Surgery

## 2016-12-02 VITALS — Ht 63.0 in | Wt 139.0 lb

## 2016-12-02 DIAGNOSIS — G5762 Lesion of plantar nerve, left lower limb: Secondary | ICD-10-CM

## 2016-12-02 DIAGNOSIS — M7742 Metatarsalgia, left foot: Secondary | ICD-10-CM | POA: Diagnosis not present

## 2016-12-02 MED ORDER — LIDOCAINE HCL 1 % IJ SOLN
1.0000 mL | INTRAMUSCULAR | Status: AC | PRN
  Administered 2016-12-02: 1 mL

## 2016-12-02 MED ORDER — METHYLPREDNISOLONE ACETATE 40 MG/ML IJ SUSP
40.0000 mg | INTRAMUSCULAR | Status: AC | PRN
  Administered 2016-12-02: 40 mg via INTRA_ARTICULAR
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
## 2016-12-03 ENCOUNTER — Ambulatory Visit: Payer: Self-pay | Admitting: Licensed Clinical Social Worker

## 2016-12-14 ENCOUNTER — Ambulatory Visit (INDEPENDENT_AMBULATORY_CARE_PROVIDER_SITE_OTHER): Payer: BLUE CROSS/BLUE SHIELD | Admitting: Licensed Clinical Social Worker

## 2016-12-14 DIAGNOSIS — F3341 Major depressive disorder, recurrent, in partial remission: Secondary | ICD-10-CM | POA: Diagnosis not present

## 2016-12-15 ENCOUNTER — Encounter: Payer: BLUE CROSS/BLUE SHIELD | Admitting: Orthotics

## 2016-12-25 ENCOUNTER — Ambulatory Visit (INDEPENDENT_AMBULATORY_CARE_PROVIDER_SITE_OTHER): Payer: BLUE CROSS/BLUE SHIELD | Admitting: Orthopedic Surgery

## 2016-12-25 ENCOUNTER — Ambulatory Visit (INDEPENDENT_AMBULATORY_CARE_PROVIDER_SITE_OTHER): Payer: BLUE CROSS/BLUE SHIELD | Admitting: Family

## 2016-12-25 DIAGNOSIS — M7742 Metatarsalgia, left foot: Secondary | ICD-10-CM

## 2016-12-25 DIAGNOSIS — G5762 Lesion of plantar nerve, left lower limb: Secondary | ICD-10-CM | POA: Diagnosis not present
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
## 2016-12-29 DIAGNOSIS — M5383 Other specified dorsopathies, cervicothoracic region: Secondary | ICD-10-CM | POA: Diagnosis not present

## 2016-12-29 DIAGNOSIS — M9905 Segmental and somatic dysfunction of pelvic region: Secondary | ICD-10-CM | POA: Diagnosis not present

## 2016-12-29 DIAGNOSIS — M25551 Pain in right hip: Secondary | ICD-10-CM | POA: Diagnosis not present

## 2016-12-29 DIAGNOSIS — M9901 Segmental and somatic dysfunction of cervical region: Secondary | ICD-10-CM | POA: Diagnosis not present

## 2017-01-01 ENCOUNTER — Telehealth (INDEPENDENT_AMBULATORY_CARE_PROVIDER_SITE_OTHER): Payer: Self-pay | Admitting: Orthopedic Surgery

## 2017-01-01 NOTE — Telephone Encounter (Signed)
Dawn Porter would like to go ahead and schedule surgery for her Morton's neuroma.  She is looking to have this done very soon, possibly before August 4th.  She was going to get a 3rd opinion, but has decided she would like Dr. Sharol Given to perform the procedure.  Can you please fill out a surgical sheet and I will call to schedule.

## 2017-01-04 NOTE — Telephone Encounter (Signed)
Blue sheet completed.

## 2017-01-05 ENCOUNTER — Ambulatory Visit: Payer: Self-pay | Admitting: Licensed Clinical Social Worker

## 2017-01-05 ENCOUNTER — Ambulatory Visit (HOSPITAL_COMMUNITY): Payer: Self-pay | Admitting: Psychiatry

## 2017-01-06 ENCOUNTER — Encounter: Payer: Self-pay | Admitting: Podiatry

## 2017-01-06 ENCOUNTER — Ambulatory Visit (INDEPENDENT_AMBULATORY_CARE_PROVIDER_SITE_OTHER): Payer: BLUE CROSS/BLUE SHIELD

## 2017-01-06 ENCOUNTER — Ambulatory Visit (INDEPENDENT_AMBULATORY_CARE_PROVIDER_SITE_OTHER): Payer: BLUE CROSS/BLUE SHIELD | Admitting: Podiatry

## 2017-01-06 DIAGNOSIS — D3610 Benign neoplasm of peripheral nerves and autonomic nervous system, unspecified: Secondary | ICD-10-CM

## 2017-01-06 DIAGNOSIS — M79671 Pain in right foot: Secondary | ICD-10-CM | POA: Diagnosis not present

## 2017-01-06 DIAGNOSIS — M7752 Other enthesopathy of left foot: Secondary | ICD-10-CM | POA: Diagnosis not present

## 2017-01-06 DIAGNOSIS — M778 Other enthesopathies, not elsewhere classified: Secondary | ICD-10-CM

## 2017-01-06 DIAGNOSIS — M779 Enthesopathy, unspecified: Secondary | ICD-10-CM

## 2017-01-06 NOTE — Patient Instructions (Signed)

## 2017-01-07 NOTE — Telephone Encounter (Signed)
I called Dawn Porter to discuss scheduling surgery.  Her phone number rang and then stated that "this number does not have voicemail set up". Will attempt to call again tomorrow.

## 2017-01-07 NOTE — Progress Notes (Signed)
Subjective:    Patient ID: Dawn Porter, female   DOB: 26 y.o.   MRN: [id number]   HPI patient presents with mother stating that she is still having pain in her left foot and she had been to another physician and had another injection done which only gave temporary relief. States that it seems to be hurting all the time with occasional shooting pains also noted and it's now been going on for over 2 years and close to 3 years    ROS      Objective:  Physical Exam neurovascular status is intact with continued inflammation pain in both the third metatarsophalangeal joint and also moderate in the third interspace with a irritation when I did do a Mulder sign. She has had previous injection she's had previous orthotics she's had previous immobilization without relief and the symptoms are to the point where they are difficult for her to be as ambulatory as she wants     Assessment:    Very difficult to make complete determination but appears to be probability for some form of inflammatory chronic low grade capsulitis and also possibility for neuroma symptomatology     Plan:    H&P and all conditions reviewed with patient. At this point she has tried orthotics she's tried immobilization she's tried anti-inflammatories in several different types of injections without relief and she wants definitive solution and I did discuss consideration of shortening osteotomy with exploration of the interspace and possible neuroma excision. I spent a great deal of time with this going over with her and her mother that there is no guarantees that this will solve her problem and she may be no better than she was previously. They understand this and read consent form going over alternative treatments complications and then sign consent form. I did dispense air fracture walker with all instructions on usage and also discussed the told recovery can take approximate 6 months to one year. Patient is scheduled for  outpatient surgery in the next several weeks and is encouraged to call with any questions

## 2017-01-12 ENCOUNTER — Ambulatory Visit (INDEPENDENT_AMBULATORY_CARE_PROVIDER_SITE_OTHER): Payer: BLUE CROSS/BLUE SHIELD | Admitting: Licensed Clinical Social Worker

## 2017-01-12 DIAGNOSIS — F3341 Major depressive disorder, recurrent, in partial remission: Secondary | ICD-10-CM | POA: Diagnosis not present

## 2017-01-13 ENCOUNTER — Encounter: Payer: Self-pay | Admitting: Podiatry

## 2017-01-13 DIAGNOSIS — M216X1 Other acquired deformities of right foot: Secondary | ICD-10-CM | POA: Diagnosis not present

## 2017-01-13 DIAGNOSIS — M21542 Acquired clubfoot, left foot: Secondary | ICD-10-CM | POA: Diagnosis not present

## 2017-01-13 DIAGNOSIS — G43909 Migraine, unspecified, not intractable, without status migrainosus: Secondary | ICD-10-CM | POA: Diagnosis not present

## 2017-01-13 DIAGNOSIS — G5761 Lesion of plantar nerve, right lower limb: Secondary | ICD-10-CM | POA: Diagnosis not present

## 2017-01-13 DIAGNOSIS — G5762 Lesion of plantar nerve, left lower limb: Secondary | ICD-10-CM | POA: Diagnosis not present

## 2017-01-13 HISTORY — PX: FOOT SURGERY: SHX648

## 2017-01-14 ENCOUNTER — Telehealth: Payer: Self-pay | Admitting: Podiatry

## 2017-01-14 ENCOUNTER — Other Ambulatory Visit: Payer: Self-pay

## 2017-01-14 MED ORDER — ONDANSETRON HCL 4 MG PO TABS: 4.0000 mg | ORAL_TABLET | Freq: Three times a day (TID) | ORAL | 0 refills | Status: DC | PRN

## 2017-01-14 MED ORDER — HYDROMORPHONE HCL 4 MG PO TABS: 4.0000 mg | ORAL_TABLET | ORAL | 0 refills | Status: DC | PRN

## 2017-01-14 NOTE — Progress Notes (Signed)
Spoke with patient regarding post op pain. She states that the Vicodin and ibuprofen prescribed is not giving her any relief. I advised her to rest, elevate and continue to ice behind the knee. Advised her to loosen ace bandage to help relieve throbbing if needed. She is to call with any acute symptom changes. Dilaudid 4mg  #20 Q 4 hrs prn and Zofran prescribed per Dr Paulla Dolly

## 2017-01-14 NOTE — Telephone Encounter (Signed)
Pt called saying Dr. Paulla Dolly did her surgery yesterday and she had to call and speak to the doctor on call because she is in severe pain and was told a new Rx would be written for her.

## 2017-01-19 ENCOUNTER — Encounter: Payer: Self-pay | Admitting: Podiatry

## 2017-01-20 ENCOUNTER — Ambulatory Visit (INDEPENDENT_AMBULATORY_CARE_PROVIDER_SITE_OTHER): Payer: BLUE CROSS/BLUE SHIELD | Admitting: Podiatry

## 2017-01-20 ENCOUNTER — Ambulatory Visit (INDEPENDENT_AMBULATORY_CARE_PROVIDER_SITE_OTHER): Payer: BLUE CROSS/BLUE SHIELD

## 2017-01-20 DIAGNOSIS — M779 Enthesopathy, unspecified: Secondary | ICD-10-CM

## 2017-01-20 DIAGNOSIS — D3610 Benign neoplasm of peripheral nerves and autonomic nervous system, unspecified: Secondary | ICD-10-CM

## 2017-01-20 DIAGNOSIS — M7752 Other enthesopathy of left foot: Secondary | ICD-10-CM | POA: Diagnosis not present

## 2017-01-20 DIAGNOSIS — M778 Other enthesopathies, not elsewhere classified: Secondary | ICD-10-CM

## 2017-01-21 ENCOUNTER — Ambulatory Visit: Payer: BLUE CROSS/BLUE SHIELD

## 2017-01-21 NOTE — Progress Notes (Signed)
Subjective:    Patient ID: Dawn Porter, female   DOB: 26 y.o.   MRN: [id number]   HPI patient states she's feeling a lot better on her left foot with minimal discomfort or swelling    ROS      Objective:  Physical Exam neurovascular status intact with negative Homans sign and patient found have well coapted incision site left third metatarsal third interspace with reduced discomfort and slight numbness between third and fourth toes noted     Assessment:    Appears to be doing well post neuroma and metatarsal osteotomy third left with patient wearing boot     Plan:  H&P x-rays reviewed and advised on continued boot usage anti-inflammatories elevation and compression. Reappoint to recheck again in the next several weeks  X-rays indicate the osteotomy is healing well with screw in place with no other pathology noted

## 2017-01-22 ENCOUNTER — Telehealth: Payer: Self-pay | Admitting: *Deleted

## 2017-01-22 MED ORDER — HYDROMORPHONE HCL 4 MG PO TABS: 4.0000 mg | ORAL_TABLET | ORAL | 0 refills | Status: DC | PRN

## 2017-01-22 NOTE — Telephone Encounter (Signed)
Pt states she is still having a good deal of pain at night especially and would like refill of the dilaudid. Dr. Josephina Shih refill as previously. Pt states will have to pick up rx on Monday, and will continue the Ibuprofen 800mg  3 times a day. I told her the ibuprofen 800mg  would be a good idea until she could pick up the rx.

## 2017-01-27 ENCOUNTER — Telehealth: Payer: Self-pay

## 2017-01-27 NOTE — Telephone Encounter (Signed)
Attempted to call patient in regards to post op pain, unable to learn voice mail

## 2017-02-01 ENCOUNTER — Ambulatory Visit (HOSPITAL_COMMUNITY): Payer: Self-pay | Admitting: Psychiatry

## 2017-02-04 ENCOUNTER — Encounter: Payer: BLUE CROSS/BLUE SHIELD | Admitting: Podiatry

## 2017-02-05 ENCOUNTER — Ambulatory Visit (INDEPENDENT_AMBULATORY_CARE_PROVIDER_SITE_OTHER): Payer: BLUE CROSS/BLUE SHIELD | Admitting: Podiatry

## 2017-02-05 ENCOUNTER — Ambulatory Visit (INDEPENDENT_AMBULATORY_CARE_PROVIDER_SITE_OTHER): Payer: BLUE CROSS/BLUE SHIELD

## 2017-02-05 ENCOUNTER — Encounter: Payer: BLUE CROSS/BLUE SHIELD | Admitting: Podiatry

## 2017-02-05 ENCOUNTER — Encounter: Payer: Self-pay | Admitting: Podiatry

## 2017-02-05 DIAGNOSIS — M779 Enthesopathy, unspecified: Principal | ICD-10-CM

## 2017-02-05 DIAGNOSIS — M778 Other enthesopathies, not elsewhere classified: Secondary | ICD-10-CM

## 2017-02-05 DIAGNOSIS — M7752 Other enthesopathy of left foot: Secondary | ICD-10-CM | POA: Diagnosis not present

## 2017-02-05 DIAGNOSIS — D3610 Benign neoplasm of peripheral nerves and autonomic nervous system, unspecified: Secondary | ICD-10-CM

## 2017-02-08 ENCOUNTER — Ambulatory Visit: Payer: Self-pay | Admitting: Licensed Clinical Social Worker

## 2017-02-08 NOTE — Progress Notes (Signed)
Subjective:    Patient ID: Dawn Porter, female   DOB: 26 y.o.   MRN: [id number]   HPI patient states she seems to be improving with her left foot and continues to get better with swelling still if she's on it too long    ROS      Objective:  Physical Exam neurovascular status intact negative Homans sign noted with patient's left foot healing well with wound edges well coapted good alignment and mild discomfort but appears to be improving     Assessment:    Doing better from having had foot surgery left     Plan:  Advised on the continuation of compression elevation and the utilization of anti-inflammatories. Patient will gradually return soft shoe starting next several weeks  X-rays indicate the osteotomy is healing well screw in place with good alignment noted

## 2017-02-10 ENCOUNTER — Encounter: Payer: BLUE CROSS/BLUE SHIELD | Admitting: Podiatry

## 2017-02-10 ENCOUNTER — Other Ambulatory Visit (HOSPITAL_COMMUNITY): Payer: Self-pay | Admitting: Psychiatry

## 2017-02-10 DIAGNOSIS — F4312 Post-traumatic stress disorder, chronic: Secondary | ICD-10-CM

## 2017-02-11 ENCOUNTER — Other Ambulatory Visit (HOSPITAL_COMMUNITY): Payer: Self-pay | Admitting: Psychiatry

## 2017-02-11 DIAGNOSIS — F4312 Post-traumatic stress disorder, chronic: Secondary | ICD-10-CM

## 2017-02-11 MED ORDER — FLUOXETINE HCL 40 MG PO CAPS: 40.0000 mg | ORAL_CAPSULE | Freq: Every day | ORAL | 0 refills | Status: DC

## 2017-02-11 NOTE — Telephone Encounter (Signed)
Hey there, we can reschedule her for a 30 minutes follow-up if she calls the office. Sounds like she had missed the visit due to surgery recovery.

## 2017-02-16 ENCOUNTER — Ambulatory Visit (INDEPENDENT_AMBULATORY_CARE_PROVIDER_SITE_OTHER): Payer: BLUE CROSS/BLUE SHIELD | Admitting: Women's Health

## 2017-02-16 ENCOUNTER — Encounter: Payer: Self-pay | Admitting: Women's Health

## 2017-02-16 VITALS — BP 118/80 | Ht 63.0 in | Wt 145.0 lb

## 2017-02-16 DIAGNOSIS — Z01419 Encounter for gynecological examination (general) (routine) without abnormal findings: Secondary | ICD-10-CM | POA: Diagnosis not present

## 2017-02-16 DIAGNOSIS — Z30011 Encounter for initial prescription of contraceptive pills: Secondary | ICD-10-CM

## 2017-02-16 MED ORDER — LO LOESTRIN FE 1 MG-10 MCG / 10 MCG PO TABS: 1.0000 | ORAL_TABLET | Freq: Every day | ORAL | 4 refills | Status: DC

## 2017-02-16 NOTE — Patient Instructions (Signed)
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
## 2017-02-17 ENCOUNTER — Telehealth: Payer: Self-pay | Admitting: *Deleted

## 2017-02-17 LAB — PAP IG W/ RFLX HPV ASCU

## 2017-02-17 MED ORDER — NORETHIN ACE-ETH ESTRAD-FE 1-20 MG-MCG(24) PO TABS: 1.0000 | ORAL_TABLET | Freq: Every day | ORAL | 3 refills | Status: DC

## 2017-02-17 NOTE — Progress Notes (Signed)
This encounter was created in error - please disregard.

## 2017-02-17 NOTE — Telephone Encounter (Signed)
Pt was prescribed lo Loestrin fe pharmacy said medication is not a covered drug,alternavitive Rx? Please advise

## 2017-02-17 NOTE — Telephone Encounter (Signed)
are any of the 10 g Loestrin covered? If so okay, if not Loestrin 24 1 by mouth daily #3 packs 4 refills.

## 2017-02-17 NOTE — Telephone Encounter (Signed)
Rx sent for Loestrin 24 sent to pharmacy. No other medication listed for coverage.

## 2017-03-05 ENCOUNTER — Ambulatory Visit (INDEPENDENT_AMBULATORY_CARE_PROVIDER_SITE_OTHER): Payer: Self-pay | Admitting: Podiatry

## 2017-03-05 ENCOUNTER — Ambulatory Visit (INDEPENDENT_AMBULATORY_CARE_PROVIDER_SITE_OTHER): Payer: BLUE CROSS/BLUE SHIELD

## 2017-03-05 ENCOUNTER — Encounter: Payer: Self-pay | Admitting: Podiatry

## 2017-03-05 DIAGNOSIS — D3610 Benign neoplasm of peripheral nerves and autonomic nervous system, unspecified: Secondary | ICD-10-CM

## 2017-03-08 ENCOUNTER — Encounter (HOSPITAL_COMMUNITY): Payer: Self-pay | Admitting: Psychiatry

## 2017-03-08 NOTE — Progress Notes (Signed)
Subjective:    Patient ID: Dawn Porter, female   DOB: 26 y.o.   MRN: [id number]   HPI patient states that she seems to be gradually improving does not have the pain she had prior to surgery    ROS      Objective:  Physical Exam neurovascular status intact with patient's left forefoot still showing moderate edema but the incision site itself is healing well with a small amount of numbness between the third and fourth toe secondary to neuroma excision     Assessment:    Doing well overall left foot with moderate edema still noted     Plan:  H&P x-rays reviewed and advised this patient continue compression elevation and shoe gear modification with gradual increase in activity and reappoint in 4 weeks or earlier if needed  X-rays indicate the osteotomy is healing well screws in place with good sign that there is no pain that she had prior to surgery

## 2017-03-18 ENCOUNTER — Telehealth: Payer: BLUE CROSS/BLUE SHIELD | Admitting: Physician Assistant

## 2017-03-18 DIAGNOSIS — B3731 Acute candidiasis of vulva and vagina: Secondary | ICD-10-CM

## 2017-03-18 DIAGNOSIS — B373 Candidiasis of vulva and vagina: Secondary | ICD-10-CM

## 2017-03-18 MED ORDER — FLUCONAZOLE 150 MG PO TABS: 150.0000 mg | ORAL_TABLET | Freq: Once | ORAL | 0 refills | Status: AC

## 2017-03-18 NOTE — Progress Notes (Signed)

## 2017-04-09 ENCOUNTER — Ambulatory Visit (INDEPENDENT_AMBULATORY_CARE_PROVIDER_SITE_OTHER): Payer: BLUE CROSS/BLUE SHIELD | Admitting: Podiatry

## 2017-04-09 ENCOUNTER — Ambulatory Visit (INDEPENDENT_AMBULATORY_CARE_PROVIDER_SITE_OTHER): Payer: BLUE CROSS/BLUE SHIELD

## 2017-04-09 ENCOUNTER — Encounter: Payer: Self-pay | Admitting: Podiatry

## 2017-04-09 DIAGNOSIS — M7752 Other enthesopathy of left foot: Secondary | ICD-10-CM

## 2017-04-09 DIAGNOSIS — D3610 Benign neoplasm of peripheral nerves and autonomic nervous system, unspecified: Secondary | ICD-10-CM | POA: Diagnosis not present

## 2017-04-09 DIAGNOSIS — M779 Enthesopathy, unspecified: Secondary | ICD-10-CM

## 2017-04-09 DIAGNOSIS — M778 Other enthesopathies, not elsewhere classified: Secondary | ICD-10-CM

## 2017-04-09 MED ORDER — TRAMADOL HCL 50 MG PO TABS: 50.0000 mg | ORAL_TABLET | Freq: Three times a day (TID) | ORAL | 2 refills | Status: DC

## 2017-04-14 NOTE — Progress Notes (Signed)
Subjective:    Patient ID: Dawn Porter, female   DOB: 26 y.o.   MRN: [id number]   HPI patient states doing pretty well but still having some swelling if she's been on her foot to long. Concerned She thinks her third and fourth toes are curling    ROS      Objective:  Physical Exam neurovascular status intact muscle strength is active with patient noted to have mild edema around the third metatarsal left that's localized in nature with diminishment of discomfort noted but still some pain associated with what appears to be the swelling     Assessment:  Chronic irritation of the metatarsal secondary to previous surgery with slow healing but appears to overall slowly be healing in adequate fashion       Plan:   H&P and discussed and tested the strength of the third and fourth toes spine need to be active. I am hoping that this will not be an issue for her but she still has swelling and while swelling and it's difficult to make complete determination. I went ahead and advised on continued compression elevation and ambulation and reviewed x-rays and patient will be seen back 6 weeks or earlier if needed and try tramadol to see if it'll help with the discomfort she experiences during the day  X-rays indicate the screws in place the metatarsals and good alignment with no indications of pathology

## 2017-04-16 ENCOUNTER — Ambulatory Visit (INDEPENDENT_AMBULATORY_CARE_PROVIDER_SITE_OTHER): Payer: BLUE CROSS/BLUE SHIELD | Admitting: Gynecology

## 2017-04-16 ENCOUNTER — Encounter: Payer: Self-pay | Admitting: Gynecology

## 2017-04-16 VITALS — BP 122/80

## 2017-04-16 DIAGNOSIS — R102 Pelvic and perineal pain: Secondary | ICD-10-CM | POA: Diagnosis not present

## 2017-04-16 DIAGNOSIS — N926 Irregular menstruation, unspecified: Secondary | ICD-10-CM

## 2017-04-16 NOTE — Progress Notes (Signed)
    Dawn Porter [birth year] [id number]        26 y.o.  G0P0000 presents having seen Dr. Toney Rakes 08/2016 with pelvic discomfort.  Ultrasound showed 3 cm ovarian simple cyst.  He gave her Depo-Provera 150 mg to "suppress the cyst" has had no periods since then but had erratic spotting on and off.  Has 3 weeks of lower pelvic cramping and discomfort.  No nausea vomiting diarrhea constipation.  No fever or chills.  No frequency dysuria urgency low back pain.  No vaginal discharge or odor or irritation.  Has birth control pills at home and is going to start these once she has a regular menses.  The patient also requests STD screening.  No known exposure but wants to be screened.  Past medical history,surgical history, problem list, medications, allergies, family history and social history were all reviewed and documented in the EPIC chart.  Directed ROS with pertinent positives and negatives documented in the history of present illness/assessment and plan.  Exam: Wandra Scot assistant Vitals:   04/16/17 1014  BP: 122/80   General appearance:  Normal Abdomen soft nontender without masses guarding rebound Pelvic external BUS vagina normal.  Cervix normal.  Uterus normal size midline mobile nontender.  Adnexa without masses or tenderness.  Assessment/Plan:  26 y.o. G0P0000 with history of irregular bleeding follow Depo-Provera.  Lower abdominal cramping/discomfort over the last several weeks.  Reviewed with patient not unusual to have irregular bleeding following Depo-Provera.  Is sexually active consistently using condoms.  Possibility of early IUP or ectopic also discussed.  Will check qualitative hCG.  Recommended baseline ultrasound for pelvic surveillance noting history of small physiologic cyst in the past causing pain.  Patient will follow-up for the ultrasound and then will be triaged based upon these results.  ASAP sooner follow-up if worsening pain or other symptoms  develop.    Anastasio Auerbach MD, 10:30 AM 04/16/2017

## 2017-04-16 NOTE — Patient Instructions (Signed)
Follow up for ultrasound as scheduled 

## 2017-04-17 LAB — C. TRACHOMATIS/N. GONORRHOEAE RNA
C. TRACHOMATIS RNA, TMA: NOT DETECTED
N. GONORRHOEAE RNA, TMA: NOT DETECTED

## 2017-04-17 LAB — HCG, SERUM, QUALITATIVE: PREG SERUM: NEGATIVE

## 2017-04-26 ENCOUNTER — Ambulatory Visit (HOSPITAL_COMMUNITY): Payer: Self-pay | Admitting: Psychiatry

## 2017-04-27 ENCOUNTER — Encounter (HOSPITAL_COMMUNITY): Payer: Self-pay | Admitting: Psychiatry

## 2017-04-27 ENCOUNTER — Ambulatory Visit (INDEPENDENT_AMBULATORY_CARE_PROVIDER_SITE_OTHER): Payer: BLUE CROSS/BLUE SHIELD | Admitting: Psychiatry

## 2017-04-27 VITALS — BP 108/78 | HR 92 | Ht 63.25 in | Wt 152.0 lb

## 2017-04-27 DIAGNOSIS — F339 Major depressive disorder, recurrent, unspecified: Secondary | ICD-10-CM

## 2017-04-27 DIAGNOSIS — R5383 Other fatigue: Secondary | ICD-10-CM | POA: Diagnosis not present

## 2017-04-27 DIAGNOSIS — R4581 Low self-esteem: Secondary | ICD-10-CM | POA: Diagnosis not present

## 2017-04-27 DIAGNOSIS — Z87891 Personal history of nicotine dependence: Secondary | ICD-10-CM

## 2017-04-27 DIAGNOSIS — R45851 Suicidal ideations: Secondary | ICD-10-CM | POA: Diagnosis not present

## 2017-04-27 DIAGNOSIS — F4312 Post-traumatic stress disorder, chronic: Secondary | ICD-10-CM

## 2017-04-27 DIAGNOSIS — F5104 Psychophysiologic insomnia: Secondary | ICD-10-CM

## 2017-04-27 DIAGNOSIS — F515 Nightmare disorder: Secondary | ICD-10-CM

## 2017-04-27 MED ORDER — FLUOXETINE HCL 20 MG PO CAPS: 20.0000 mg | ORAL_CAPSULE | Freq: Every day | ORAL | 1 refills | Status: DC

## 2017-04-27 MED ORDER — BUPROPION HCL ER (XL) 150 MG PO TB24: 150.0000 mg | ORAL_TABLET | ORAL | 1 refills | Status: DC

## 2017-04-27 MED ORDER — GABAPENTIN 300 MG PO CAPS: 300.0000 mg | ORAL_CAPSULE | Freq: Every day | ORAL | 1 refills | Status: DC

## 2017-04-27 MED ORDER — FLUOXETINE HCL 10 MG PO CAPS: 10.0000 mg | ORAL_CAPSULE | Freq: Every day | ORAL | 0 refills | Status: DC
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
## 2017-04-27 NOTE — Patient Instructions (Signed)
Prozac 10 mg x 2 weeks, then increase to 20 mg daily  Start gabapentin at bedtime  Start wellbutrin in the morning   Serotonin Syndrome Serotonin is a brain chemical that regulates the nervous system, which includes the brain, spinal cord, and nerves. Serotonin appears to play a role in all types of behavior, including appetite, emotions, movement, thinking, and response to stress. Excessively high levels of serotonin in the body can cause serotonin syndrome, which is a very dangerous condition. What are the causes? This condition can be caused by taking medicines or drugs that increase the level of serotonin in your body. These include:  Antidepressant medicines.  Migraine medicines.  Certain pain medicines.  Certain recreational drugs, including ecstasy, LSD, cocaine, and amphetamines.  Over-the-counter cough or cold medicines that contain dextromethorphan.  Certain herbal supplements, including St. John's wort, ginseng, and nutmeg.  This condition usually occurs when you take these medicines or drugs in combination, but it can also happen with a high dose of a single medicine or drug. What increases the risk? This condition is more likely to develop in:  People who have recently increased the dosage of medicine that increases the serotonin level.  People who just started taking medicine that increases the serotonin level.  What are the signs or symptoms? Symptoms of this condition usually happens within several hours of a medicine change. Symptoms include:  Headache.  Muscle twitching or stiffness.  Diarrhea.  Confusion.  Restlessness or agitation.  Shivering or goose bumps.  Loss of muscle coordination.  Rapid heart rate.  Sweating.  Severe cases of serotonin syndromecan cause:  Irregular heartbeat.  Seizures.  Loss of consciousness.  High fever.  How is this diagnosed? This condition is diagnosed with a medical history and physical exam. You will  be asked aboutyour symptoms and your use of medicines and recreational drugs. Your health care provider may also order lab work or additional tests to rule out other causes of your symptoms. How is this treated? The treatment for this condition depends on the severity of your symptoms. For mild cases, stopping the medicine that caused your condition is usually all that is needed. For moderate to severe cases, hospitalization is required to monitor you and to prevent further muscle damage. Follow these instructions at home:  Take over-the-counter and prescription medicines only as told by your health care provider. This is important.  Check with your health care provider before you start taking any new prescriptions, over-the-counter medicines, herbs, or supplements.  Avoid combining any medicines that can cause this condition to occur.  Keep all follow-up visits as told by your health care provider.This is important.  Maintain a healthy lifestyle. ? Eat healthy foods. ? Get plenty of sleep. ? Exercise regularly. ? Do not drink alcohol. ? Do not use recreational drugs. Contact a health care provider if:  Medicines do not seem to be helping.  Your symptoms do not improve or they get worse.  You have trouble taking care of yourself. Get help right away if:  You have worsening confusion, severe headache, chest pain, high fever, seizures, or loss of consciousness.  You have serious thoughts about hurting yourself or others.  You experience serious side effects of medicine, such as swelling of your face, lips, tongue, or throat. This information is not intended to replace advice given to you by your health care provider. Make sure you discuss any questions you have with your health care provider. Document Released: 07/09/2004 Document Revised: 01/25/2016 Document  Reviewed: 06/14/2014 Elsevier Interactive Patient Education  [employer].

## 2017-05-05 ENCOUNTER — Ambulatory Visit (INDEPENDENT_AMBULATORY_CARE_PROVIDER_SITE_OTHER): Payer: BLUE CROSS/BLUE SHIELD | Admitting: Licensed Clinical Social Worker

## 2017-05-05 ENCOUNTER — Encounter (HOSPITAL_COMMUNITY): Payer: Self-pay | Admitting: Licensed Clinical Social Worker

## 2017-05-05 ENCOUNTER — Ambulatory Visit (INDEPENDENT_AMBULATORY_CARE_PROVIDER_SITE_OTHER): Payer: BLUE CROSS/BLUE SHIELD | Admitting: Gynecology

## 2017-05-05 ENCOUNTER — Ambulatory Visit (INDEPENDENT_AMBULATORY_CARE_PROVIDER_SITE_OTHER): Payer: BLUE CROSS/BLUE SHIELD

## 2017-05-05 ENCOUNTER — Encounter: Payer: Self-pay | Admitting: Gynecology

## 2017-05-05 VITALS — BP 118/76

## 2017-05-05 DIAGNOSIS — F4312 Post-traumatic stress disorder, chronic: Secondary | ICD-10-CM | POA: Diagnosis not present

## 2017-05-05 DIAGNOSIS — Z8659 Personal history of other mental and behavioral disorders: Secondary | ICD-10-CM | POA: Diagnosis not present

## 2017-05-05 DIAGNOSIS — R102 Pelvic and perineal pain: Secondary | ICD-10-CM

## 2017-05-05 DIAGNOSIS — N926 Irregular menstruation, unspecified: Secondary | ICD-10-CM

## 2017-05-05 MED ORDER — NORETHIN ACE-ETH ESTRAD-FE 1-20 MG-MCG PO TABS: 1.0000 | ORAL_TABLET | Freq: Every day | ORAL | 12 refills | Status: DC
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
## 2017-05-05 NOTE — Patient Instructions (Signed)
Start on the birth control pills as we discussed.  Follow-up if any issues with these.  Follow-up next September for annual exam when due.

## 2017-05-05 NOTE — Progress Notes (Addendum)
    Dawn Porter 1990-08-21 [id number]        26 y.o.  G0P0000 presents for ultrasound.  Has had several weeks of pelvic cramping.  Received Depo-Provera in June with no bleeding afterwards.  She subsequently had started a period 05/01/2017.  Past medical history,surgical history, problem list, medications, allergies, family history and social history were all reviewed and documented in the EPIC chart.  Directed ROS with pertinent positives and negatives documented in the history of present illness/assessment and plan.  Exam: Vitals:   05/05/17 1216  BP: 118/76   General appearance:  Normal  Ultrasound transvaginal shows uterus normal size and echotexture.  Endometrial echo 4.8 mm.  Right and left ovaries normal with bilateral follicles noted.  Cul-de-sac negative.  Assessment/Plan:  26 y.o. G0P0000 amenorrhea since Depo-Provera in 11/2016 now with first menses.  Proceeding this having cramping which I think was related to the premenstrual situation.  No longer having significant cramping.  Ultrasound is normal.  History of prior ovarian cysts resolved.  Would like to start on oral contraceptives for contraception.  Is not sexually active now.  Loestrin 1/20 equivalent prescribed to start now.  Backup contraception through first pack discussed.  Follow-up if any issues after starting.  Follow-up in September when due for annual exam.    Anastasio Auerbach MD, 12:25 PM 05/05/2017

## 2017-05-21 ENCOUNTER — Encounter: Payer: Self-pay | Admitting: Podiatry

## 2017-05-21 ENCOUNTER — Ambulatory Visit (INDEPENDENT_AMBULATORY_CARE_PROVIDER_SITE_OTHER): Payer: BLUE CROSS/BLUE SHIELD | Admitting: Podiatry

## 2017-05-21 ENCOUNTER — Ambulatory Visit (INDEPENDENT_AMBULATORY_CARE_PROVIDER_SITE_OTHER): Payer: BLUE CROSS/BLUE SHIELD

## 2017-05-21 VITALS — BP 98/60 | HR 101 | Temp 97.8°F | Resp 16

## 2017-05-21 DIAGNOSIS — G5762 Lesion of plantar nerve, left lower limb: Secondary | ICD-10-CM | POA: Diagnosis not present

## 2017-05-25 NOTE — Progress Notes (Signed)
Subjective:   Patient ID: Dawn Porter, female   DOB: 26 y.o.   MRN: [id number]   HPI Patient presents stating she still having pain but it seems different than before surgery and she still gets some swelling if she is been on her foot too long.   ROS      Objective:  Physical Exam  Neurovascular status intact with patient found to have well-healing surgical site left forefoot with digits in good alignment mild edema with discomfort still noted upon deep palpation.     Assessment:  X-rays indicate that the osteotomies are healing well which I conveyed the patient but she still has mild discomfort which is I am hoping is the postoperative.     Plan:  H&P x-rays reviewed condition discussed.  At this point she will continue with well supported shoes encouraged to continue activity levels and will be seen back 2 months or earlier as needed.  Patient will take tramadol as needed.  X-ray report indicates osteotomies are healing well with screws in place in good alignment

## 2017-05-31 ENCOUNTER — Other Ambulatory Visit (HOSPITAL_COMMUNITY): Payer: BLUE CROSS/BLUE SHIELD | Attending: Psychiatry | Admitting: Psychiatry

## 2017-05-31 ENCOUNTER — Encounter (HOSPITAL_COMMUNITY): Payer: Self-pay | Admitting: Psychiatry

## 2017-05-31 DIAGNOSIS — Z87891 Personal history of nicotine dependence: Secondary | ICD-10-CM | POA: Insufficient documentation

## 2017-05-31 DIAGNOSIS — F4310 Post-traumatic stress disorder, unspecified: Secondary | ICD-10-CM | POA: Diagnosis not present

## 2017-05-31 DIAGNOSIS — F419 Anxiety disorder, unspecified: Secondary | ICD-10-CM | POA: Insufficient documentation

## 2017-05-31 DIAGNOSIS — F319 Bipolar disorder, unspecified: Secondary | ICD-10-CM | POA: Insufficient documentation

## 2017-05-31 DIAGNOSIS — J45909 Unspecified asthma, uncomplicated: Secondary | ICD-10-CM | POA: Diagnosis not present
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
## 2017-06-01 ENCOUNTER — Other Ambulatory Visit (HOSPITAL_COMMUNITY): Payer: BLUE CROSS/BLUE SHIELD | Admitting: Psychiatry

## 2017-06-01 DIAGNOSIS — F319 Bipolar disorder, unspecified: Secondary | ICD-10-CM | POA: Diagnosis not present

## 2017-06-01 DIAGNOSIS — J45909 Unspecified asthma, uncomplicated: Secondary | ICD-10-CM | POA: Diagnosis not present

## 2017-06-01 DIAGNOSIS — F4310 Post-traumatic stress disorder, unspecified: Secondary | ICD-10-CM | POA: Diagnosis not present

## 2017-06-01 DIAGNOSIS — Z87891 Personal history of nicotine dependence: Secondary | ICD-10-CM | POA: Diagnosis not present

## 2017-06-01 DIAGNOSIS — F419 Anxiety disorder, unspecified: Secondary | ICD-10-CM | POA: Diagnosis not present

## 2017-06-01 NOTE — Progress Notes (Signed)
    Daily Group Progress Note  Program: IOP  Group Time: 9:00-12:00  Participation Level: Active  Behavioral Response: Appropriate  Type of Therapy:  Group Therapy  Summary of Progress: Pt. Presents as calm, with bright affect. Reported that she slept well last night and that she did not have any nightmares. Pt. Stated that she continues to be very hungry and received feedback from the counselor about how to begin recognizing her natural hunger post-trauma. Pt. participated in discussion about the importance of establishing clear personal boundaries in personal relationships. Pt. Participated in grief and loss group with the Chaplain.      Nancie Neas, LPC

## 2017-06-02 ENCOUNTER — Other Ambulatory Visit (HOSPITAL_COMMUNITY): Payer: BLUE CROSS/BLUE SHIELD | Admitting: Psychiatry

## 2017-06-02 DIAGNOSIS — Z87891 Personal history of nicotine dependence: Secondary | ICD-10-CM | POA: Diagnosis not present

## 2017-06-02 DIAGNOSIS — F319 Bipolar disorder, unspecified: Secondary | ICD-10-CM | POA: Diagnosis not present

## 2017-06-02 DIAGNOSIS — F4310 Post-traumatic stress disorder, unspecified: Secondary | ICD-10-CM

## 2017-06-02 DIAGNOSIS — J45909 Unspecified asthma, uncomplicated: Secondary | ICD-10-CM | POA: Diagnosis not present

## 2017-06-02 DIAGNOSIS — F419 Anxiety disorder, unspecified: Secondary | ICD-10-CM | POA: Diagnosis not present

## 2017-06-02 NOTE — Progress Notes (Signed)
    Daily Group Progress Note  Program: IOP  Group Time: 9:00-12:00  Participation Level: Active  Behavioral Response: Appropriate  Type of Therapy:  Group Therapy  Summary of Progress: Pt. Presented as open, talkative, receptive to the group process. Pt. Participated in discussion about her personal history of perfectionism and how it resulted in a crisis when she did not perform well in school. Pt. Received challenge from the therapist to identify an activity that she would be comfortable doing poorly and sitting with the discomfort of her performance. Pt. Was able to identify learning to play the violin which she tried before but put down when she received negative feedback from her teammates. Pt. Participated in guided breathing meditation activity. Pt. Discussed that it was a challenge for her initially but was able to complete the activity and processed the activity with the group.      Nancie Neas, LPC

## 2017-06-03 ENCOUNTER — Other Ambulatory Visit (HOSPITAL_COMMUNITY): Payer: BLUE CROSS/BLUE SHIELD | Admitting: Psychiatry

## 2017-06-03 DIAGNOSIS — F319 Bipolar disorder, unspecified: Secondary | ICD-10-CM | POA: Diagnosis not present

## 2017-06-03 DIAGNOSIS — J45909 Unspecified asthma, uncomplicated: Secondary | ICD-10-CM | POA: Diagnosis not present

## 2017-06-03 DIAGNOSIS — F4310 Post-traumatic stress disorder, unspecified: Secondary | ICD-10-CM | POA: Diagnosis not present

## 2017-06-03 DIAGNOSIS — F419 Anxiety disorder, unspecified: Secondary | ICD-10-CM | POA: Diagnosis not present

## 2017-06-03 DIAGNOSIS — Z87891 Personal history of nicotine dependence: Secondary | ICD-10-CM | POA: Diagnosis not present

## 2017-06-03 NOTE — Progress Notes (Signed)
    Daily Group Progress Note  Program: IOP  Group Time: 9:00-12:00  Participation Level: Active  Behavioral Response: Appropriate  Type of Therapy:  Group Therapy  Summary of Progress: Pt. Presented with brightened affect, talkative, engaged in the group process. Pt. Discussed that she did well yesterday after group, but was more tired than she expected. Pt. shared that she allowed herself to sleep. Pt. Discussed being supported by her animals. Pt. Participated in yoga therapy with Jan Fireman, LPC    Nancie Neas, LPC

## 2017-06-04 ENCOUNTER — Other Ambulatory Visit (HOSPITAL_COMMUNITY): Payer: BLUE CROSS/BLUE SHIELD | Admitting: Psychiatry

## 2017-06-04 DIAGNOSIS — Z87891 Personal history of nicotine dependence: Secondary | ICD-10-CM | POA: Diagnosis not present

## 2017-06-04 DIAGNOSIS — F4310 Post-traumatic stress disorder, unspecified: Secondary | ICD-10-CM

## 2017-06-04 DIAGNOSIS — F319 Bipolar disorder, unspecified: Secondary | ICD-10-CM | POA: Diagnosis not present

## 2017-06-04 DIAGNOSIS — F419 Anxiety disorder, unspecified: Secondary | ICD-10-CM | POA: Diagnosis not present

## 2017-06-04 DIAGNOSIS — J45909 Unspecified asthma, uncomplicated: Secondary | ICD-10-CM | POA: Diagnosis not present

## 2017-06-07 ENCOUNTER — Other Ambulatory Visit (HOSPITAL_COMMUNITY): Payer: BLUE CROSS/BLUE SHIELD | Admitting: Psychiatry

## 2017-06-07 NOTE — Progress Notes (Signed)
    Daily Group Progress Note  Program: IOP  Group Time: 9:00-12:00  Participation Level: Active  Behavioral Response: Appropriate  Type of Therapy:  Group Therapy  Summary of Progress: Pt. Presented with brightened affect, talkative, engaged in the group process. Pt. Discussed her plans to visit her boyfriend over the weekend. Pt. Discussed her history with stress disorder that caused her to lose her hair, making the choice the shave her head, and developing self-acceptance. Pt. Participated in grounding exercise "This is what I know to be true about myself today". Pt. Shared that she knew to be true that she felt very anxious about seeing her boyfriend because she was afraid about the changes that he would see in her since her depression crisis. Pt. Acknowledged that he was a good person and does not judge her and make her feel that she is a burden because of her depression that these are judgements that she makes of herself. Pt. Also shared that she feels excited that she is beginning to feel better and that she is able to travel again.     Nancie Neas, LPC

## 2017-06-10 ENCOUNTER — Other Ambulatory Visit (HOSPITAL_COMMUNITY): Payer: BLUE CROSS/BLUE SHIELD | Admitting: Psychiatry

## 2017-06-10 DIAGNOSIS — J45909 Unspecified asthma, uncomplicated: Secondary | ICD-10-CM | POA: Diagnosis not present

## 2017-06-10 DIAGNOSIS — F419 Anxiety disorder, unspecified: Secondary | ICD-10-CM | POA: Diagnosis not present

## 2017-06-10 DIAGNOSIS — F4310 Post-traumatic stress disorder, unspecified: Secondary | ICD-10-CM

## 2017-06-10 DIAGNOSIS — Z87891 Personal history of nicotine dependence: Secondary | ICD-10-CM | POA: Diagnosis not present

## 2017-06-10 DIAGNOSIS — F319 Bipolar disorder, unspecified: Secondary | ICD-10-CM | POA: Diagnosis not present

## 2017-06-10 NOTE — Progress Notes (Signed)
    Daily Group Progress Note  Program: IOP  Group Time: 9:00-12:00  Participation Level: Active  Behavioral Response: Appropriate  Type of Therapy:  Group Therapy  Summary of Progress: Pt. Presented as talkative, engaged in the group process. Pt. Shared that she broke up with her boyfriend since her last group session, but does not see this as a loss and feels relieved to have ended the relationship. Pt. Shared with the group that she wants to work on identifying what is good about herself. Pt. Connected this goal to her relationship with her mother and her sister who is a celebrity. Pt. Received feedback that her feelings are may be related not only to her thoughts about herself but also about her mother's thoughts of inadequacy that are affecting her relationships with her daughters. Pt. Shared her love and compassion for her mother difficult awareness that her mother may be hurting in this way given her mother's other challenges. Pt. Participated in the "I Am" poem exercise, but did not share her poem with the group.     Nancie Neas, LPC

## 2017-06-11 ENCOUNTER — Other Ambulatory Visit (HOSPITAL_COMMUNITY): Payer: BLUE CROSS/BLUE SHIELD

## 2017-06-14 ENCOUNTER — Other Ambulatory Visit (HOSPITAL_COMMUNITY): Payer: BLUE CROSS/BLUE SHIELD

## 2017-06-16 ENCOUNTER — Other Ambulatory Visit (HOSPITAL_COMMUNITY): Payer: BLUE CROSS/BLUE SHIELD

## 2017-06-17 ENCOUNTER — Other Ambulatory Visit (HOSPITAL_COMMUNITY): Payer: BLUE CROSS/BLUE SHIELD

## 2017-06-18 ENCOUNTER — Other Ambulatory Visit (HOSPITAL_COMMUNITY): Payer: BLUE CROSS/BLUE SHIELD

## 2017-06-21 ENCOUNTER — Other Ambulatory Visit (HOSPITAL_COMMUNITY): Payer: BLUE CROSS/BLUE SHIELD | Attending: Psychiatry | Admitting: Psychiatry

## 2017-06-21 DIAGNOSIS — F4310 Post-traumatic stress disorder, unspecified: Secondary | ICD-10-CM

## 2017-06-22 ENCOUNTER — Other Ambulatory Visit (HOSPITAL_COMMUNITY): Payer: BLUE CROSS/BLUE SHIELD

## 2017-06-22 NOTE — Progress Notes (Signed)
    Daily Group Progress Note  Program: IOP  Group Time: 9:00-12:00  Participation Level: Active  Behavioral Response: Appropriate  Type of Therapy:  Group Therapy  Summary of Progress: Pt. Presented as calm, talkative, with bright affect, connects well with other group members.. Pt. 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 Pt. Discussed that she is getting ready to return to school in two weeks, high expectations for herself, and involvement in school activities. Pt. Discussed her current sleep routine which she acknowledges is not good for her school schedule and worked with therapist to discuss ways that she can alter her sleep schedule given plans to return to school. Pt. Discussed her needs for competition which is satisfied by her work and was encouraged to think about how she can continue to balance this with her sleep needs.      Nancie Neas, LPC

## 2017-06-23 ENCOUNTER — Other Ambulatory Visit (HOSPITAL_COMMUNITY): Payer: BLUE CROSS/BLUE SHIELD

## 2017-06-24 ENCOUNTER — Other Ambulatory Visit (HOSPITAL_COMMUNITY): Payer: BLUE CROSS/BLUE SHIELD

## 2017-06-25 ENCOUNTER — Other Ambulatory Visit (HOSPITAL_COMMUNITY): Payer: BLUE CROSS/BLUE SHIELD | Admitting: Psychiatry

## 2017-06-25 DIAGNOSIS — F4310 Post-traumatic stress disorder, unspecified: Secondary | ICD-10-CM | POA: Diagnosis not present

## 2017-06-28 ENCOUNTER — Ambulatory Visit: Payer: BLUE CROSS/BLUE SHIELD | Admitting: Podiatry

## 2017-06-28 ENCOUNTER — Other Ambulatory Visit (HOSPITAL_COMMUNITY): Payer: BLUE CROSS/BLUE SHIELD

## 2017-06-28 NOTE — Progress Notes (Signed)
    Daily Group Progress Note  Program: IOP  Group Time: 9:00-12:00  Participation Level: Active  Behavioral Response: Appropriate  Type of Therapy:  Group Therapy  Summary of Progress: Pt. Presented as talkative, mildly depressed and anxious. Pt. Discussed that she was stressed because she was having to deal with transitioning her grandmother to palliative care and this had been very stressful her her mother. Pt. Discussed her mother's history of anxiety and her role in the family had been to always "keep it together" for every one else. Pt. Discussed not feeling that she had the luxury of allowing herself to be cared for because others needed so much. Pt. Received feedback from the group to continue to do what she was doing of being present for her family, but not to forget to take care of herself in the process. Pt. Participated in discussion about the benefits of yoga to healing of anxiety and depression.      Nancie Neas, LPC

## 2017-06-29 ENCOUNTER — Ambulatory Visit (INDEPENDENT_AMBULATORY_CARE_PROVIDER_SITE_OTHER): Payer: BLUE CROSS/BLUE SHIELD | Admitting: Orthopedic Surgery

## 2017-06-29 ENCOUNTER — Encounter (INDEPENDENT_AMBULATORY_CARE_PROVIDER_SITE_OTHER): Payer: Self-pay | Admitting: Orthopedic Surgery

## 2017-06-29 ENCOUNTER — Other Ambulatory Visit (HOSPITAL_COMMUNITY): Payer: BLUE CROSS/BLUE SHIELD

## 2017-06-29 VITALS — Ht 63.0 in | Wt 152.0 lb

## 2017-06-29 DIAGNOSIS — M7742 Metatarsalgia, left foot: Secondary | ICD-10-CM | POA: Diagnosis not present

## 2017-06-29 DIAGNOSIS — G5762 Lesion of plantar nerve, left lower limb: Secondary | ICD-10-CM | POA: Diagnosis not present
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
## 2017-06-30 ENCOUNTER — Other Ambulatory Visit (HOSPITAL_COMMUNITY): Payer: BLUE CROSS/BLUE SHIELD

## 2017-07-01 ENCOUNTER — Other Ambulatory Visit (HOSPITAL_COMMUNITY): Payer: BLUE CROSS/BLUE SHIELD

## 2017-07-02 ENCOUNTER — Ambulatory Visit: Payer: BLUE CROSS/BLUE SHIELD | Admitting: Podiatry

## 2017-07-02 ENCOUNTER — Other Ambulatory Visit (HOSPITAL_COMMUNITY): Payer: BLUE CROSS/BLUE SHIELD

## 2017-07-05 ENCOUNTER — Other Ambulatory Visit (HOSPITAL_COMMUNITY): Payer: BLUE CROSS/BLUE SHIELD

## 2017-07-06 ENCOUNTER — Other Ambulatory Visit (HOSPITAL_COMMUNITY): Payer: BLUE CROSS/BLUE SHIELD | Admitting: Psychiatry

## 2017-07-06 NOTE — Patient Instructions (Signed)
D:  Discharged patient today from [employer].  A:  F/U with Dr. Daron Offer on 07-30-17 @ 9:30 a.m and Anselmo Rod, LCSW on 08-24-17 @ 1:30 pm.  Encouraged support groups.

## 2017-07-06 NOTE — Progress Notes (Signed)
Patient ID: Dawn Porter, female   DOB: 01-04-1991, 27 y.o.   MRN: [id number] [facility] IOP DISCHARGE NOTE  Patient:  Dawn Porter DOB:  07-Jul-1990  Date of Admission: 05/31/2017  Date of Discharge: 07/06/2017  Reason for Admission:depression and anxiety  IOP Course:attended sporadically and missed many more days than she attended.  Was still depressed and anxious when last seem on 11 Jan but did feel better and has returned to school  Mental Status at Discharge:no suicidal thoughts via phone contacts  Diagnosis:   Level of Care:  IOP  Discharge destination:has appointments with her therapist and provider     Comments:  none  The patient received suicide prevention pamphlet:  Yes   CLARK, RITA
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
## 2017-07-07 ENCOUNTER — Other Ambulatory Visit (HOSPITAL_COMMUNITY): Payer: BLUE CROSS/BLUE SHIELD

## 2017-07-08 ENCOUNTER — Other Ambulatory Visit (HOSPITAL_COMMUNITY): Payer: BLUE CROSS/BLUE SHIELD

## 2017-07-09 ENCOUNTER — Other Ambulatory Visit (HOSPITAL_COMMUNITY): Payer: BLUE CROSS/BLUE SHIELD

## 2017-07-09 ENCOUNTER — Telehealth: Payer: Self-pay | Admitting: Podiatry

## 2017-07-09 NOTE — Telephone Encounter (Signed)
Left message informing pt that if she was having pain so that she could not complete ADL or walk then we would need to see her again to be evaluated and a handicap sticker could be picked up at the appt.

## 2017-07-09 NOTE — Telephone Encounter (Signed)
Pt would like to have handicap sticker renewed.

## 2017-07-12 ENCOUNTER — Other Ambulatory Visit (HOSPITAL_COMMUNITY): Payer: BLUE CROSS/BLUE SHIELD

## 2017-07-13 ENCOUNTER — Telehealth: Payer: Self-pay | Admitting: Podiatry

## 2017-07-13 ENCOUNTER — Telehealth (HOSPITAL_COMMUNITY): Payer: Self-pay

## 2017-07-13 ENCOUNTER — Other Ambulatory Visit (HOSPITAL_COMMUNITY): Payer: BLUE CROSS/BLUE SHIELD

## 2017-07-13 NOTE — Telephone Encounter (Signed)
Patient needs a letter stating her diagnosis to help her with her school. 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 She does not try to miss class, but some days she just can not go. She would like a letter of proof. Please review and advise, thank you

## 2017-07-14 ENCOUNTER — Telehealth (HOSPITAL_COMMUNITY): Payer: Self-pay

## 2017-07-14 ENCOUNTER — Encounter (HOSPITAL_COMMUNITY): Payer: Self-pay | Admitting: Psychiatry

## 2017-07-14 ENCOUNTER — Other Ambulatory Visit (HOSPITAL_COMMUNITY): Payer: BLUE CROSS/BLUE SHIELD

## 2017-07-14 NOTE — Telephone Encounter (Signed)
Patient called student services and there are no forms, they directed her to a website that had instructions as to what the patient needs - it is a letter with certain guidelines. I printed the form and put it in your mailbox.

## 2017-07-14 NOTE — Telephone Encounter (Signed)
07/14/17 - Patient came to pick up letter from Dr. Daron Offer. DL#: [phone]

## 2017-07-14 NOTE — Telephone Encounter (Signed)
She should fill out paperwork with her office of disability and they will fax [place] any forms needed, she needs to sign release, etc for [place] to communicate with school

## 2017-07-14 NOTE — Telephone Encounter (Signed)
Okay dokey thank you!

## 2017-07-14 NOTE — Telephone Encounter (Signed)
I called patient and lvm letting her know what to do  Along with my fax and phone number.

## 2017-07-15 ENCOUNTER — Other Ambulatory Visit (HOSPITAL_COMMUNITY): Payer: BLUE CROSS/BLUE SHIELD

## 2017-07-16 ENCOUNTER — Other Ambulatory Visit (HOSPITAL_COMMUNITY): Payer: BLUE CROSS/BLUE SHIELD

## 2017-07-19 ENCOUNTER — Other Ambulatory Visit (HOSPITAL_COMMUNITY): Payer: BLUE CROSS/BLUE SHIELD

## 2017-07-20 ENCOUNTER — Other Ambulatory Visit (HOSPITAL_COMMUNITY): Payer: BLUE CROSS/BLUE SHIELD

## 2017-07-21 ENCOUNTER — Ambulatory Visit (INDEPENDENT_AMBULATORY_CARE_PROVIDER_SITE_OTHER): Payer: BLUE CROSS/BLUE SHIELD

## 2017-07-21 ENCOUNTER — Other Ambulatory Visit (HOSPITAL_COMMUNITY): Payer: BLUE CROSS/BLUE SHIELD

## 2017-07-21 ENCOUNTER — Ambulatory Visit: Payer: BLUE CROSS/BLUE SHIELD | Admitting: Podiatry

## 2017-07-21 ENCOUNTER — Encounter: Payer: Self-pay | Admitting: Podiatry

## 2017-07-21 DIAGNOSIS — M778 Other enthesopathies, not elsewhere classified: Secondary | ICD-10-CM

## 2017-07-21 DIAGNOSIS — M7752 Other enthesopathy of left foot: Secondary | ICD-10-CM | POA: Diagnosis not present

## 2017-07-21 DIAGNOSIS — M779 Enthesopathy, unspecified: Secondary | ICD-10-CM

## 2017-07-21 DIAGNOSIS — G5762 Lesion of plantar nerve, left lower limb: Secondary | ICD-10-CM | POA: Diagnosis not present

## 2017-07-21 NOTE — Progress Notes (Signed)
Subjective:   Patient ID: Dawn Porter, female   DOB: 27 y.o.   MRN: [id number]   HPI Patient presents stating she seems to be at a plateau but is having no worsening of her pain and feels like the pain is better than prior to surgery but she does feel achiness in her foot   ROS      Objective:  Physical Exam  Neurovascular status intact negative Homans sign noted with no indications currently of chronic pain syndrome except for pain out of proportion to the healing process.  Patient has good healing of the incision site and has mild forefoot edema     Assessment:  Overall seems to be gradually improving with no indications of a acute chronic pain syndrome but does have pain out of proportion to what I would expect     Plan:  Final x-rays were reviewed and advised this patient on the importance of using her foot and I do feel relatively confident that eventually this is going to heal uneventfully.  I advised her on physical therapy which we may do and patient will be seen back to recheck and I did give her a handicap sticker but I encouraged her to pursue activity  X-ray indicates the osteotomy has healed very well with excellent alignment of the third metatarsal

## 2017-07-22 ENCOUNTER — Other Ambulatory Visit (HOSPITAL_COMMUNITY): Payer: BLUE CROSS/BLUE SHIELD

## 2017-07-22 ENCOUNTER — Ambulatory Visit: Payer: BLUE CROSS/BLUE SHIELD | Admitting: Podiatry

## 2017-07-23 ENCOUNTER — Other Ambulatory Visit (HOSPITAL_COMMUNITY): Payer: BLUE CROSS/BLUE SHIELD

## 2017-07-26 ENCOUNTER — Ambulatory Visit (HOSPITAL_COMMUNITY): Payer: Self-pay | Admitting: Licensed Clinical Social Worker

## 2017-07-29 ENCOUNTER — Telehealth (HOSPITAL_COMMUNITY): Payer: Self-pay | Admitting: Psychiatry

## 2017-07-30 ENCOUNTER — Ambulatory Visit (HOSPITAL_COMMUNITY): Payer: Self-pay | Admitting: Psychiatry

## 2017-07-30 MED ORDER — GABAPENTIN 300 MG PO CAPS: 300.0000 mg | ORAL_CAPSULE | Freq: Three times a day (TID) | ORAL | 1 refills | Status: DC

## 2017-07-30 MED ORDER — FLUOXETINE HCL 20 MG PO CAPS: 20.0000 mg | ORAL_CAPSULE | Freq: Every day | ORAL | 1 refills | Status: DC

## 2017-07-30 NOTE — Telephone Encounter (Signed)
Called patient regarding concerns about anxiety.  Instructed via message to discontinue wellbutrin as this can increased anxiety during periods of increased stress.  Instructed to continue prozac and increase gabapentin to TID for augmentation of anxiety symptoms. Patient welcome to message writer via [place], and we are scheduled for follow-up next week.

## 2017-08-02 ENCOUNTER — Encounter (HOSPITAL_COMMUNITY): Payer: Self-pay | Admitting: Licensed Clinical Social Worker

## 2017-08-02 ENCOUNTER — Ambulatory Visit (INDEPENDENT_AMBULATORY_CARE_PROVIDER_SITE_OTHER): Payer: BLUE CROSS/BLUE SHIELD | Admitting: Licensed Clinical Social Worker

## 2017-08-02 DIAGNOSIS — F4312 Post-traumatic stress disorder, chronic: Secondary | ICD-10-CM
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
## 2017-08-05 ENCOUNTER — Ambulatory Visit (INDEPENDENT_AMBULATORY_CARE_PROVIDER_SITE_OTHER): Payer: BLUE CROSS/BLUE SHIELD | Admitting: Psychiatry

## 2017-08-05 ENCOUNTER — Encounter (HOSPITAL_COMMUNITY): Payer: Self-pay | Admitting: Psychiatry

## 2017-08-05 VITALS — BP 102/74 | HR 99 | Ht 64.0 in | Wt 147.0 lb

## 2017-08-05 DIAGNOSIS — Z87891 Personal history of nicotine dependence: Secondary | ICD-10-CM | POA: Diagnosis not present

## 2017-08-05 DIAGNOSIS — Z79899 Other long term (current) drug therapy: Secondary | ICD-10-CM | POA: Diagnosis not present

## 2017-08-05 DIAGNOSIS — F4312 Post-traumatic stress disorder, chronic: Secondary | ICD-10-CM | POA: Diagnosis not present

## 2017-08-05 DIAGNOSIS — Z62810 Personal history of physical and sexual abuse in childhood: Secondary | ICD-10-CM | POA: Diagnosis not present

## 2017-08-05 DIAGNOSIS — F5104 Psychophysiologic insomnia: Secondary | ICD-10-CM | POA: Diagnosis not present

## 2017-08-05 DIAGNOSIS — F4310 Post-traumatic stress disorder, unspecified: Secondary | ICD-10-CM

## 2017-08-05 MED ORDER — TOPIRAMATE 50 MG PO TABS: 50.0000 mg | ORAL_TABLET | Freq: Every day | ORAL | 1 refills | Status: DC
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
## 2017-08-16 DIAGNOSIS — F603 Borderline personality disorder: Secondary | ICD-10-CM | POA: Diagnosis not present

## 2017-08-24 ENCOUNTER — Ambulatory Visit (INDEPENDENT_AMBULATORY_CARE_PROVIDER_SITE_OTHER): Payer: BLUE CROSS/BLUE SHIELD | Admitting: Licensed Clinical Social Worker

## 2017-08-24 ENCOUNTER — Encounter (HOSPITAL_COMMUNITY): Payer: Self-pay | Admitting: Licensed Clinical Social Worker

## 2017-08-24 DIAGNOSIS — F603 Borderline personality disorder: Secondary | ICD-10-CM | POA: Diagnosis not present

## 2017-08-24 DIAGNOSIS — F4312 Post-traumatic stress disorder, chronic: Secondary | ICD-10-CM | POA: Diagnosis not present

## 2017-08-24 NOTE — Progress Notes (Signed)
   THERAPIST PROGRESS NOTE  Session Time: 1:30pm-2:30pm  Participation Level: Active  Behavioral Response: Well GroomedAlertDepressed  Type of Therapy: Individual Therapy  Treatment Goals addressed: Improve Psychiatric Symptoms, elevate mood (increased self-esteem, increased self-compassion, increased interaction), improve unhelpful thought patterns, controlled behavior, moderate mood, deliberate speech and thought process(improved social functioning, healthy adjustment to living situation), Learn about diagnosis, healthy coping skills  Interventions: Motivational Interviewing, CBT, Grounding & Mindfulness Techniques, psychoeducation  Summary: Dawn Porter is a 27 y.o. female who presents with chronic PTSD.  Suicidal/Homicidal: No - without intent/plan  Therapist Response: Dawn Porter met with clinician for an individual session.  Dawn Porter discussed her psychiatric symptoms, her current life events and her homework. Dawn Porter shared that she has been quite upset since her last visit with Dr. Daron Offer, who diagnosed her with Borderline Personality Disorder. Dawn Porter reported that she does agree with the sxs and diagnosis, but was feeling very helpless in the long term effects of treatment and in the possibility of being in treatment for her entire life. Dawn Porter reports concern about the stigma of having BPD. Clinician normalized thoughts and feelings about dx and reviewed sxs from [place]. Clinician explored willingness to seek treatment from [place], which will start on 08-25-17. Clinician discussed plan to complete 12 week program and any additional support that will be recommended.   Plan: Return again in 12-14 weeks following completion of DBT group and therapy at [facility].   Diagnosis:     Axis I: Chronic PTSD                            MAELY CLEMENTS, LCSW 08/24/2017

## 2017-08-25 DIAGNOSIS — F603 Borderline personality disorder: Secondary | ICD-10-CM | POA: Diagnosis not present

## 2017-08-26 DIAGNOSIS — F603 Borderline personality disorder: Secondary | ICD-10-CM | POA: Diagnosis not present

## 2017-08-30 DIAGNOSIS — F603 Borderline personality disorder: Secondary | ICD-10-CM | POA: Diagnosis not present

## 2017-08-31 ENCOUNTER — Telehealth: Payer: Self-pay | Admitting: Family

## 2017-08-31 ENCOUNTER — Encounter: Payer: Self-pay | Admitting: Podiatry

## 2017-08-31 DIAGNOSIS — A499 Bacterial infection, unspecified: Secondary | ICD-10-CM

## 2017-08-31 DIAGNOSIS — N390 Urinary tract infection, site not specified: Secondary | ICD-10-CM

## 2017-08-31 MED ORDER — NITROFURANTOIN MONOHYD MACRO 100 MG PO CAPS: 100.0000 mg | ORAL_CAPSULE | Freq: Two times a day (BID) | ORAL | 0 refills | Status: DC

## 2017-08-31 NOTE — Progress Notes (Signed)

## 2017-09-01 DIAGNOSIS — F603 Borderline personality disorder: Secondary | ICD-10-CM | POA: Diagnosis not present

## 2017-09-02 DIAGNOSIS — F603 Borderline personality disorder: Secondary | ICD-10-CM | POA: Diagnosis not present

## 2017-09-03 ENCOUNTER — Encounter: Payer: Self-pay | Admitting: Podiatry

## 2017-09-03 ENCOUNTER — Ambulatory Visit (INDEPENDENT_AMBULATORY_CARE_PROVIDER_SITE_OTHER): Payer: BLUE CROSS/BLUE SHIELD | Admitting: Podiatry

## 2017-09-03 DIAGNOSIS — M779 Enthesopathy, unspecified: Principal | ICD-10-CM

## 2017-09-03 DIAGNOSIS — M778 Other enthesopathies, not elsewhere classified: Secondary | ICD-10-CM

## 2017-09-03 DIAGNOSIS — M7752 Other enthesopathy of left foot: Secondary | ICD-10-CM

## 2017-09-03 DIAGNOSIS — G5762 Lesion of plantar nerve, left lower limb: Secondary | ICD-10-CM

## 2017-09-06 ENCOUNTER — Ambulatory Visit (INDEPENDENT_AMBULATORY_CARE_PROVIDER_SITE_OTHER): Payer: BLUE CROSS/BLUE SHIELD | Admitting: Orthotics

## 2017-09-06 DIAGNOSIS — M7752 Other enthesopathy of left foot: Secondary | ICD-10-CM | POA: Diagnosis not present

## 2017-09-06 DIAGNOSIS — F603 Borderline personality disorder: Secondary | ICD-10-CM | POA: Diagnosis not present

## 2017-09-06 DIAGNOSIS — M779 Enthesopathy, unspecified: Principal | ICD-10-CM

## 2017-09-06 DIAGNOSIS — M778 Other enthesopathies, not elsewhere classified: Secondary | ICD-10-CM

## 2017-09-06 NOTE — Progress Notes (Signed)
Subjective:   Patient ID: Dawn Porter, female   DOB: 27 y.o.   MRN: [id number]   HPI Left patient presents stating that she is been doing better with her forefoot but does have continued discomfort in the heel bilateral that is kind of localized in nature with no indications of pathology.  States that it is more of a tingle in her heel and is not sore when she gets up in the morning or after periods of sitting   ROS      Objective:  Physical Exam  Neurovascular status intact with patient found to have continued diminishment of forefoot discomfort left after osteotomy and neuroma excision.  There still is mild swelling but overall it is improving and patient is noted to have very minimal discomfort plantar heel region bilateral     Assessment:  Neurovascular status intact with patient's left foot improving but developing mild heel pain bilateral with orthotics that were made for previous condition and may not be palpable to the current condition with also change in foot structure secondary to the previous surgery     Plan:  Reviewed condition at great length and I do think long-term it would probably be best to get her into a new orthotic to try to better cut the heels and reduce plantar pressure without metatarsal pads and then we can rehab the second pair so she can have alternating type orthotics.  She will see ped orthotist for this condition have new orthotics casted and have a second pair rehabilitated.  I do think she continues to improve from her surgery

## 2017-09-06 NOTE — Progress Notes (Signed)
Patient came in today for foot orthotics; ordered 2nd pair from [place] with DEEP heel cup, 2* Valgus FF post to relieve heel pain; also heel punch plus horsehoe cushion.

## 2017-09-08 DIAGNOSIS — F603 Borderline personality disorder: Secondary | ICD-10-CM | POA: Diagnosis not present

## 2017-09-09 ENCOUNTER — Encounter (HOSPITAL_COMMUNITY): Payer: Self-pay | Admitting: Psychiatry

## 2017-09-09 ENCOUNTER — Other Ambulatory Visit (HOSPITAL_COMMUNITY): Payer: Self-pay | Admitting: Psychiatry

## 2017-09-09 DIAGNOSIS — F603 Borderline personality disorder: Secondary | ICD-10-CM | POA: Diagnosis not present

## 2017-09-09 DIAGNOSIS — R41840 Attention and concentration deficit: Secondary | ICD-10-CM

## 2017-09-09 DIAGNOSIS — F4312 Post-traumatic stress disorder, chronic: Secondary | ICD-10-CM

## 2017-09-09 MED ORDER — METHYLPHENIDATE HCL 10 MG PO TABS: 10.0000 mg | ORAL_TABLET | Freq: Two times a day (BID) | ORAL | 0 refills | Status: DC

## 2017-09-13 DIAGNOSIS — F603 Borderline personality disorder: Secondary | ICD-10-CM | POA: Diagnosis not present

## 2017-09-15 DIAGNOSIS — F603 Borderline personality disorder: Secondary | ICD-10-CM | POA: Diagnosis not present

## 2017-09-16 ENCOUNTER — Ambulatory Visit (HOSPITAL_COMMUNITY): Payer: Self-pay | Admitting: Licensed Clinical Social Worker

## 2017-09-16 DIAGNOSIS — F603 Borderline personality disorder: Secondary | ICD-10-CM | POA: Diagnosis not present

## 2017-09-20 DIAGNOSIS — F603 Borderline personality disorder: Secondary | ICD-10-CM | POA: Diagnosis not present

## 2017-09-22 ENCOUNTER — Ambulatory Visit (HOSPITAL_COMMUNITY): Payer: Self-pay | Admitting: Psychiatry

## 2017-09-22 DIAGNOSIS — F603 Borderline personality disorder: Secondary | ICD-10-CM | POA: Diagnosis not present

## 2017-09-27 ENCOUNTER — Other Ambulatory Visit: Payer: BLUE CROSS/BLUE SHIELD | Admitting: Orthotics

## 2017-09-29 DIAGNOSIS — F603 Borderline personality disorder: Secondary | ICD-10-CM | POA: Diagnosis not present

## 2017-09-30 ENCOUNTER — Ambulatory Visit (HOSPITAL_COMMUNITY): Payer: Self-pay | Admitting: Licensed Clinical Social Worker

## 2017-09-30 DIAGNOSIS — F603 Borderline personality disorder: Secondary | ICD-10-CM | POA: Diagnosis not present

## 2017-10-01 DIAGNOSIS — F603 Borderline personality disorder: Secondary | ICD-10-CM | POA: Diagnosis not present

## 2017-10-04 DIAGNOSIS — F603 Borderline personality disorder: Secondary | ICD-10-CM | POA: Diagnosis not present

## 2017-10-07 ENCOUNTER — Ambulatory Visit (INDEPENDENT_AMBULATORY_CARE_PROVIDER_SITE_OTHER): Payer: BLUE CROSS/BLUE SHIELD | Admitting: Psychiatry

## 2017-10-07 ENCOUNTER — Encounter (HOSPITAL_COMMUNITY): Payer: Self-pay | Admitting: Psychiatry

## 2017-10-07 DIAGNOSIS — R41840 Attention and concentration deficit: Secondary | ICD-10-CM | POA: Diagnosis not present

## 2017-10-07 DIAGNOSIS — Z79899 Other long term (current) drug therapy: Secondary | ICD-10-CM | POA: Diagnosis not present

## 2017-10-07 DIAGNOSIS — F603 Borderline personality disorder: Secondary | ICD-10-CM | POA: Diagnosis not present

## 2017-10-07 DIAGNOSIS — Z87891 Personal history of nicotine dependence: Secondary | ICD-10-CM

## 2017-10-07 DIAGNOSIS — F4310 Post-traumatic stress disorder, unspecified: Secondary | ICD-10-CM

## 2017-10-07 DIAGNOSIS — F4312 Post-traumatic stress disorder, chronic: Secondary | ICD-10-CM

## 2017-10-07 MED ORDER — TOPIRAMATE 100 MG PO TABS: 100.0000 mg | ORAL_TABLET | Freq: Every day | ORAL | 1 refills | Status: DC

## 2017-10-07 MED ORDER — FLUOXETINE HCL 40 MG PO CAPS: 40.0000 mg | ORAL_CAPSULE | Freq: Every day | ORAL | 1 refills | Status: DC

## 2017-10-07 MED ORDER — METHYLPHENIDATE HCL 10 MG PO TABS: 10.0000 mg | ORAL_TABLET | Freq: Two times a day (BID) | ORAL | 0 refills | Status: DC
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
## 2017-10-08 DIAGNOSIS — F603 Borderline personality disorder: Secondary | ICD-10-CM | POA: Diagnosis not present

## 2017-10-11 DIAGNOSIS — F603 Borderline personality disorder: Secondary | ICD-10-CM | POA: Diagnosis not present

## 2017-10-14 ENCOUNTER — Ambulatory Visit (HOSPITAL_COMMUNITY): Payer: Self-pay | Admitting: Licensed Clinical Social Worker

## 2017-10-15 DIAGNOSIS — F603 Borderline personality disorder: Secondary | ICD-10-CM | POA: Diagnosis not present

## 2017-10-21 DIAGNOSIS — F603 Borderline personality disorder: Secondary | ICD-10-CM | POA: Diagnosis not present

## 2017-10-22 DIAGNOSIS — F603 Borderline personality disorder: Secondary | ICD-10-CM | POA: Diagnosis not present

## 2017-10-23 ENCOUNTER — Encounter (HOSPITAL_COMMUNITY): Payer: Self-pay | Admitting: Psychiatry

## 2017-10-25 ENCOUNTER — Encounter: Payer: Self-pay | Admitting: Internal Medicine

## 2017-10-25 ENCOUNTER — Ambulatory Visit: Payer: BLUE CROSS/BLUE SHIELD | Admitting: Orthotics

## 2017-10-25 DIAGNOSIS — F603 Borderline personality disorder: Secondary | ICD-10-CM | POA: Diagnosis not present

## 2017-10-25 DIAGNOSIS — M7742 Metatarsalgia, left foot: Secondary | ICD-10-CM

## 2017-10-25 NOTE — Progress Notes (Signed)
Patient came in today to pick up custom made foot orthotics.  The goals were accomplished and the patient reported no dissatisfaction with said orthotics.  Patient was advised of breakin period and how to report any issues. 

## 2017-10-26 NOTE — Telephone Encounter (Signed)
Please advise on if patient needs OV with Sharlet [facility] or nurse visit.   Thanks!

## 2017-10-28 ENCOUNTER — Ambulatory Visit (HOSPITAL_COMMUNITY): Payer: Self-pay | Admitting: Licensed Clinical Social Worker

## 2017-10-29 ENCOUNTER — Encounter: Payer: Self-pay | Admitting: Internal Medicine

## 2017-10-29 ENCOUNTER — Other Ambulatory Visit (INDEPENDENT_AMBULATORY_CARE_PROVIDER_SITE_OTHER): Payer: BLUE CROSS/BLUE SHIELD

## 2017-10-29 ENCOUNTER — Ambulatory Visit: Payer: BLUE CROSS/BLUE SHIELD | Admitting: Internal Medicine

## 2017-10-29 VITALS — BP 110/70 | HR 66 | Temp 98.4°F | Ht 64.0 in | Wt 145.0 lb

## 2017-10-29 DIAGNOSIS — Z23 Encounter for immunization: Secondary | ICD-10-CM | POA: Diagnosis not present

## 2017-10-29 DIAGNOSIS — Z7189 Other specified counseling: Secondary | ICD-10-CM | POA: Diagnosis not present

## 2017-10-29 DIAGNOSIS — R202 Paresthesia of skin: Secondary | ICD-10-CM

## 2017-10-29 DIAGNOSIS — Z7184 Encounter for health counseling related to travel: Secondary | ICD-10-CM | POA: Insufficient documentation

## 2017-10-29 DIAGNOSIS — M79672 Pain in left foot: Secondary | ICD-10-CM | POA: Insufficient documentation

## 2017-10-29 LAB — LIPID PANEL
CHOL/HDL RATIO: 4
CHOLESTEROL: 138 mg/dL (ref 0–200)
HDL: 37.8 mg/dL — ABNORMAL LOW (ref >39.00)
LDL CALC: 86 mg/dL (ref 0–99)
NonHDL: 100.24
Triglycerides: 72 mg/dL (ref 0.0–149.0)
VLDL: 14.4 mg/dL (ref 0.0–40.0)

## 2017-10-29 LAB — VITAMIN B12: Vitamin B-12: 799 pg/mL (ref 211–911)

## 2017-10-29 LAB — TSH: TSH: 1.44 u[IU]/mL (ref 0.35–4.50)

## 2017-10-29 LAB — HEMOGLOBIN A1C: Hgb A1c MFr Bld: 5.4 % (ref 4.6–6.5)

## 2017-10-29 LAB — VITAMIN D 25 HYDROXY (VIT D DEFICIENCY, FRACTURES): VITD: 26.63 ng/mL — AB (ref 30.00–100.00)

## 2017-10-29 MED ORDER — CIPROFLOXACIN HCL 500 MG PO TABS
500.0000 mg | ORAL_TABLET | Freq: Two times a day (BID) | ORAL | 0 refills | Status: DC
Start: 2017-10-29 — End: 2018-02-21

## 2017-10-29 MED ORDER — TYPHOID VACCINE PO CPDR: 1.0000 | DELAYED_RELEASE_CAPSULE | ORAL | 0 refills | Status: DC

## 2017-10-29 MED ORDER — ACETAZOLAMIDE 125 MG PO TABS: 125.0000 mg | ORAL_TABLET | Freq: Two times a day (BID) | ORAL | 0 refills | Status: DC

## 2017-10-29 NOTE — Patient Instructions (Addendum)
We have sent in acetazolamide to take 1 pill twice a day if needed for altitude sickness.   We have sent in ciprofloxacin to take if needed for traveler's diarrhea or for urinary tract infection. Take 1 pill twice a day until gone.   We have sent in the thyroid medicine to take every other day until gone at least 1 week prior to travel.   We have given you the hepatitis A vaccine today and you need a second one in at least 3 months to give you life long protection.    Altitude Sickness Altitude sickness occurs when a person goes to a high altitude without first letting the body adjust (acclimate) to the higher altitude.Depending on the severity, altitude sickness can be a medical emergency. It can develop into a life-threatening condition. What are the causes? This condition is caused by rapidly going to an altitude of at least 8,200 ft. (2,460 m) above sea level. At this altitude, the air pressure and oxygen levels are lower. What increases the risk? This condition can happen to anyone, regardless of physical condition. However, it is more likely to develop in people who go to a high altitude quickly and are physically active at that altitude. What are the signs or symptoms? Symptoms of this condition usually develop within 72 hours of arriving at the high altitude. Symptoms include:  A severe headache.  Nausea and vomiting.  Shortness of breath.  Dizziness.  Confusion.  Uncoordinated movements.  Fatigue.  Trouble sleeping.  Weakness.  Hallucinations.  How is this diagnosed? This condition may be diagnosed with a medical history and a physical exam. Sometimes a chest X-ray is taken. How is this treated? In most cases, treatment is not needed. Symptoms gradually go away on their own in 3-5 days. If you do need treatment, you will be moved to a lower altitude of 1,800 ft. (540 m) above sea level or lower as quickly and safely as possible. You may also be given oxygen and  medicines to help with breathing. If your condition is severe, you may need to stay in the hospital. Follow these instructions at home:  If you must exercise, do so lightly for the first 24-36 hours after treatment.  Drink enough fluids to keep your urine clear or pale yellow.  Eat small, light meals.  Avoid: ? Any tobacco products, such as cigarettes, chewing tobacco, or e-cigarettes. ? Taking calming medicines (sedatives). ? Alcohol.  Stay at a low altitude.  Have someone stay with you until you feel stable.  Keep all follow-up visits as told by your health care provider. This is important. How is this prevented?  Go to higher altitudes slowly, giving your body time to acclimate.  Go to higher altitudes during the daytime and return to lower altitudes at night.  Give your body a few days to adjust to a change in altitude before starting strenuous physical activities.  Ask your health care provider about medicines you can take to prevent altitude sickness. Get help right away if:  You have: ? Chest pain or tightness. ? A fast heartbeat. ? A severe headache. ? A severe cough. ? Difficulty walking. ? Difficulty concentrating. ? Severe shortness of breath at rest or with exertion.  You feel confused. This information is not intended to replace advice given to you by your health care provider. Make sure you discuss any questions you have with your health care provider. Document Released: 05/29/2000 Document Revised: 10/09/2015 Document Reviewed: 02/23/2015 Elsevier Interactive  Patient Education  [employer].

## 2017-10-29 NOTE — Assessment & Plan Note (Signed)
Checking thyroid, vitamin D and B12. Checking HgA1c.

## 2017-10-29 NOTE — Progress Notes (Signed)
   Subjective:    Patient ID: Dawn Porter, female    DOB: Jan 17, 1991, 27 y.o.   MRN: [id number]  HPI The patient is a 27 YO female coming in for travel advice. She is going to [place] ([place], [place], [place], [place] She is going with tour group and no rural areas. They will likely eat street food. She has vaccine records and recent TDAP 2014. Denies prior hep A vaccine. Denies feeling sick. No new allergies to medicines.  She is also having new tingling in feet since starting topamax and wants to make sure there is not another cause.  Review of Systems  Constitutional: Negative.   HENT: Negative.   Eyes: Negative.   Respiratory: Negative for cough, chest tightness and shortness of breath.   Cardiovascular: Negative for chest pain, palpitations and leg swelling.  Gastrointestinal: Negative for abdominal distention, abdominal pain, constipation, diarrhea, nausea and vomiting.  Musculoskeletal: Negative.   Skin: Negative.   Neurological: Negative.   Psychiatric/Behavioral: Negative.       Objective:   Physical Exam  Constitutional: She is oriented to person, place, and time. She appears well-developed and well-nourished.  HENT:  Head: Normocephalic and atraumatic.  Eyes: EOM are normal.  Neck: Normal range of motion.  Cardiovascular: Normal rate and regular rhythm.  Pulmonary/Chest: Effort normal and breath sounds normal. No respiratory distress. She has no wheezes. She has no rales.  Abdominal: Soft. She exhibits no distension. There is no tenderness. There is no rebound.  Musculoskeletal: She exhibits no edema.  Neurological: She is alert and oriented to person, place, and time. Coordination normal.  Skin: Skin is warm and dry.   Vitals:   10/29/17 1521  BP: 110/70  Pulse: 66  Temp: 98.4 F (36.9 C)  TempSrc: Oral  SpO2: 99%  Weight: 145 lb (65.8 kg)  Height: 5\' 4"  (1.626 m)      Assessment & Plan:  Hep A vaccine given at visit

## 2017-10-29 NOTE — Assessment & Plan Note (Signed)
Hep A vaccine given, typhoid oral rx sent in. Cipro for traveler's diarrhea given. Acetazolamide for if needed for altitude sickness sent in.

## 2017-11-03 ENCOUNTER — Telehealth: Payer: Self-pay

## 2017-11-03 NOTE — Telephone Encounter (Signed)
PA started on CoverMyMeds KEY: [id number]

## 2017-11-10 NOTE — Telephone Encounter (Signed)
Pa denied not covered by insurance

## 2017-11-11 ENCOUNTER — Ambulatory Visit (HOSPITAL_COMMUNITY): Payer: Self-pay | Admitting: Licensed Clinical Social Worker

## 2017-11-12 DIAGNOSIS — F603 Borderline personality disorder: Secondary | ICD-10-CM | POA: Diagnosis not present

## 2017-11-15 DIAGNOSIS — F603 Borderline personality disorder: Secondary | ICD-10-CM | POA: Diagnosis not present

## 2017-11-16 ENCOUNTER — Ambulatory Visit (INDEPENDENT_AMBULATORY_CARE_PROVIDER_SITE_OTHER): Payer: BLUE CROSS/BLUE SHIELD | Admitting: Licensed Clinical Social Worker

## 2017-11-16 ENCOUNTER — Encounter (HOSPITAL_COMMUNITY): Payer: Self-pay | Admitting: Licensed Clinical Social Worker

## 2017-11-16 DIAGNOSIS — F4312 Post-traumatic stress disorder, chronic: Secondary | ICD-10-CM | POA: Diagnosis not present

## 2017-11-16 NOTE — Progress Notes (Signed)
   THERAPIST PROGRESS NOTE  Session Time: 4:30pm-5:30pm  Participation Level: Active  Behavioral Response: Well GroomedAlertEuthymic  Type of Therapy: Individual Therapy  Treatment Goals addressed: Improve Psychiatric Symptoms, elevate mood (increased self-esteem, increased self-compassion, increased interaction), improve unhelpful thought patterns, controlled behavior, moderate mood, deliberate speech and thought process(improved social functioning, healthy adjustment to living situation), Learn about diagnosis, healthy coping skills  Interventions: Motivational Interviewing, CBT, Grounding & Mindfulness Techniques, psychoeducation  Summary: Dawn Porter is a 27 y.o. female who presents with chronic PTSD.  Suicidal/Homicidal: No - without intent/plan  Therapist Response: [name] met with clinician for an individual session.  Dawn Porter discussed her psychiatric symptoms, her current life events and her homework. Dawn Porter shared that she continues to make progress in her DBT treatment at [facility]. She provided updates about her interactions and relationships with family and friends. She noted some stumbling blocks and identified use of DBT skills to improve her responses to stressors. Dawn Porter continues to personalize and take responsibility and blame for the behaviors of others. However, she is getting better at identifying what is her fault and what is coming from others.  Clinician processed some experiences at school and home using [place] OARS. Clinician noted the effectiveness of DBT on BPD and encouraged Dawn Porter to continue with that course of treatment.   Plan: Continue working with [employer] and Dr. Daron Offer. Discharge from [facility] for now. Return following DBT if needed.   Diagnosis:     Axis I: Chronic PTSD   [name], LCSW 11/16/2017

## 2017-11-18 DIAGNOSIS — F603 Borderline personality disorder: Secondary | ICD-10-CM | POA: Diagnosis not present

## 2017-11-19 DIAGNOSIS — F603 Borderline personality disorder: Secondary | ICD-10-CM | POA: Diagnosis not present

## 2017-11-23 ENCOUNTER — Ambulatory Visit (HOSPITAL_COMMUNITY): Payer: Self-pay | Admitting: Licensed Clinical Social Worker

## 2017-11-25 DIAGNOSIS — F603 Borderline personality disorder: Secondary | ICD-10-CM | POA: Diagnosis not present

## 2017-11-26 DIAGNOSIS — F603 Borderline personality disorder: Secondary | ICD-10-CM | POA: Diagnosis not present

## 2017-11-30 ENCOUNTER — Telehealth: Payer: BLUE CROSS/BLUE SHIELD | Admitting: Nurse Practitioner

## 2017-11-30 ENCOUNTER — Ambulatory Visit (HOSPITAL_COMMUNITY): Payer: BLUE CROSS/BLUE SHIELD | Admitting: Licensed Clinical Social Worker

## 2017-11-30 DIAGNOSIS — M546 Pain in thoracic spine: Secondary | ICD-10-CM

## 2017-11-30 DIAGNOSIS — B3731 Acute candidiasis of vulva and vagina: Secondary | ICD-10-CM

## 2017-11-30 DIAGNOSIS — G8929 Other chronic pain: Secondary | ICD-10-CM

## 2017-11-30 DIAGNOSIS — B373 Candidiasis of vulva and vagina: Secondary | ICD-10-CM

## 2017-11-30 MED ORDER — FLUCONAZOLE 150 MG PO TABS: ORAL_TABLET | ORAL | 0 refills | Status: DC

## 2017-11-30 MED ORDER — NAPROXEN 500 MG PO TABS: 500.0000 mg | ORAL_TABLET | Freq: Two times a day (BID) | ORAL | 1 refills | Status: DC

## 2017-11-30 MED ORDER — CYCLOBENZAPRINE HCL 10 MG PO TABS: 10.0000 mg | ORAL_TABLET | Freq: Three times a day (TID) | ORAL | 1 refills | Status: DC | PRN

## 2017-11-30 NOTE — Progress Notes (Signed)

## 2017-11-30 NOTE — Progress Notes (Signed)

## 2017-12-02 ENCOUNTER — Encounter (HOSPITAL_COMMUNITY): Payer: Self-pay | Admitting: Psychiatry

## 2017-12-02 ENCOUNTER — Ambulatory Visit (HOSPITAL_COMMUNITY): Payer: BLUE CROSS/BLUE SHIELD | Admitting: Psychiatry

## 2017-12-03 DIAGNOSIS — F603 Borderline personality disorder: Secondary | ICD-10-CM | POA: Diagnosis not present

## 2017-12-06 DIAGNOSIS — F4310 Post-traumatic stress disorder, unspecified: Secondary | ICD-10-CM | POA: Diagnosis not present

## 2017-12-06 DIAGNOSIS — F4312 Post-traumatic stress disorder, chronic: Secondary | ICD-10-CM | POA: Diagnosis not present

## 2017-12-07 ENCOUNTER — Ambulatory Visit (HOSPITAL_COMMUNITY): Payer: BLUE CROSS/BLUE SHIELD | Admitting: Licensed Clinical Social Worker

## 2017-12-07 DIAGNOSIS — F4310 Post-traumatic stress disorder, unspecified: Secondary | ICD-10-CM | POA: Diagnosis not present

## 2017-12-08 DIAGNOSIS — F4310 Post-traumatic stress disorder, unspecified: Secondary | ICD-10-CM | POA: Diagnosis not present

## 2017-12-09 DIAGNOSIS — F4310 Post-traumatic stress disorder, unspecified: Secondary | ICD-10-CM | POA: Diagnosis not present

## 2017-12-10 DIAGNOSIS — F4310 Post-traumatic stress disorder, unspecified: Secondary | ICD-10-CM | POA: Diagnosis not present

## 2017-12-11 DIAGNOSIS — F4310 Post-traumatic stress disorder, unspecified: Secondary | ICD-10-CM | POA: Diagnosis not present

## 2017-12-12 DIAGNOSIS — F4310 Post-traumatic stress disorder, unspecified: Secondary | ICD-10-CM | POA: Diagnosis not present

## 2017-12-13 DIAGNOSIS — F4310 Post-traumatic stress disorder, unspecified: Secondary | ICD-10-CM | POA: Diagnosis not present

## 2017-12-14 ENCOUNTER — Telehealth: Payer: BLUE CROSS/BLUE SHIELD | Admitting: Nurse Practitioner

## 2017-12-14 DIAGNOSIS — F4310 Post-traumatic stress disorder, unspecified: Secondary | ICD-10-CM | POA: Diagnosis not present

## 2017-12-14 DIAGNOSIS — N760 Acute vaginitis: Secondary | ICD-10-CM

## 2017-12-14 DIAGNOSIS — B9689 Other specified bacterial agents as the cause of diseases classified elsewhere: Secondary | ICD-10-CM

## 2017-12-14 MED ORDER — METRONIDAZOLE 500 MG PO TABS: 500.0000 mg | ORAL_TABLET | Freq: Two times a day (BID) | ORAL | 0 refills | Status: DC

## 2017-12-14 NOTE — Progress Notes (Signed)

## 2017-12-15 DIAGNOSIS — F4310 Post-traumatic stress disorder, unspecified: Secondary | ICD-10-CM | POA: Diagnosis not present

## 2017-12-16 DIAGNOSIS — F4310 Post-traumatic stress disorder, unspecified: Secondary | ICD-10-CM | POA: Diagnosis not present

## 2017-12-17 DIAGNOSIS — F4310 Post-traumatic stress disorder, unspecified: Secondary | ICD-10-CM | POA: Diagnosis not present

## 2017-12-18 DIAGNOSIS — F4310 Post-traumatic stress disorder, unspecified: Secondary | ICD-10-CM | POA: Diagnosis not present

## 2017-12-19 DIAGNOSIS — F4310 Post-traumatic stress disorder, unspecified: Secondary | ICD-10-CM | POA: Diagnosis not present

## 2017-12-20 DIAGNOSIS — F4310 Post-traumatic stress disorder, unspecified: Secondary | ICD-10-CM | POA: Diagnosis not present

## 2017-12-24 DIAGNOSIS — F603 Borderline personality disorder: Secondary | ICD-10-CM | POA: Diagnosis not present

## 2017-12-27 DIAGNOSIS — F603 Borderline personality disorder: Secondary | ICD-10-CM | POA: Diagnosis not present

## 2018-01-03 DIAGNOSIS — F603 Borderline personality disorder: Secondary | ICD-10-CM | POA: Diagnosis not present

## 2018-01-27 ENCOUNTER — Telehealth: Payer: Self-pay | Admitting: *Deleted

## 2018-01-27 NOTE — Telephone Encounter (Signed)
Patient called requesting TSH level drawn c/o fatigue, and not feeling well. I spoke with patient and relayed her PCP checked her TSH in May and appears to be normal result. I suggested she follow up with that office with symptoms. Pt verbalized she understood.

## 2018-01-31 DIAGNOSIS — F603 Borderline personality disorder: Secondary | ICD-10-CM | POA: Diagnosis not present

## 2018-02-10 DIAGNOSIS — F603 Borderline personality disorder: Secondary | ICD-10-CM | POA: Diagnosis not present

## 2018-02-21 ENCOUNTER — Ambulatory Visit: Payer: BLUE CROSS/BLUE SHIELD | Admitting: Women's Health

## 2018-02-21 ENCOUNTER — Encounter: Payer: Self-pay | Admitting: Women's Health

## 2018-02-21 VITALS — BP 118/80 | Ht 64.0 in | Wt 149.0 lb

## 2018-02-21 DIAGNOSIS — R5383 Other fatigue: Secondary | ICD-10-CM

## 2018-02-21 DIAGNOSIS — Z01419 Encounter for gynecological examination (general) (routine) without abnormal findings: Secondary | ICD-10-CM | POA: Diagnosis not present

## 2018-02-21 NOTE — Patient Instructions (Signed)
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
## 2018-02-22 LAB — CBC WITH DIFFERENTIAL/PLATELET
BASOS ABS: 29 {cells}/uL (ref 0–200)
Basophils Relative: 0.4 %
EOS PCT: 1.4 %
Eosinophils Absolute: 101 cells/uL (ref 15–500)
HCT: 41 % (ref 35.0–45.0)
HEMOGLOBIN: 13.6 g/dL (ref 11.7–15.5)
Lymphs Abs: 2261 cells/uL (ref 850–3900)
MCH: 28.4 pg (ref 27.0–33.0)
MCHC: 33.2 g/dL (ref 32.0–36.0)
MCV: 85.6 fL (ref 80.0–100.0)
MONOS PCT: 9.8 %
MPV: 10.1 fL (ref 7.5–12.5)
NEUTROS ABS: 4104 {cells}/uL (ref 1500–7800)
NEUTROS PCT: 57 %
Platelets: 419 10*3/uL — ABNORMAL HIGH (ref 140–400)
RBC: 4.79 10*6/uL (ref 3.80–5.10)
RDW: 12.3 % (ref 11.0–15.0)
Total Lymphocyte: 31.4 %
WBC mixed population: 706 cells/uL (ref 200–950)
WBC: 7.2 10*3/uL (ref 3.8–10.8)

## 2018-02-22 LAB — TSH: TSH: 1.17 mIU/L

## 2018-02-24 DIAGNOSIS — F603 Borderline personality disorder: Secondary | ICD-10-CM | POA: Diagnosis not present

## 2018-02-25 NOTE — Telephone Encounter (Signed)
Patient would like to try skyla I will forward to Abigail Butts to check benefits.

## 2018-02-25 NOTE — Telephone Encounter (Signed)
Patient was seen on 02/21/18 for annual exam per note ": Contraception options reviewed, declines". Now would like contraception.

## 2018-03-02 ENCOUNTER — Telehealth: Payer: Self-pay | Admitting: Podiatry

## 2018-03-02 NOTE — Telephone Encounter (Signed)
I told pt I had reviewed her clinicals 09/03/2017 and Dr. Paulla Dolly stated she was improving from the surgery, but if she was unable to walk a parking lot without pain, we needed to see her. I transferred pt to schedulers.

## 2018-03-02 NOTE — Telephone Encounter (Signed)
I'm a pt of Dr. Mellody Drown and I was calling to see if I can get my handicap placard renewed? I'm still having a hard time getting to and from places. If you could give me a call back and let me know when I can do this, that would be great. My number is [phone]. Thank you.

## 2018-03-03 DIAGNOSIS — F603 Borderline personality disorder: Secondary | ICD-10-CM | POA: Diagnosis not present

## 2018-03-11 ENCOUNTER — Encounter: Payer: Self-pay | Admitting: Podiatry

## 2018-03-11 ENCOUNTER — Other Ambulatory Visit: Payer: Self-pay | Admitting: Podiatry

## 2018-03-11 ENCOUNTER — Ambulatory Visit (INDEPENDENT_AMBULATORY_CARE_PROVIDER_SITE_OTHER): Payer: BLUE CROSS/BLUE SHIELD

## 2018-03-11 ENCOUNTER — Ambulatory Visit (INDEPENDENT_AMBULATORY_CARE_PROVIDER_SITE_OTHER): Payer: BLUE CROSS/BLUE SHIELD | Admitting: Podiatry

## 2018-03-11 DIAGNOSIS — M779 Enthesopathy, unspecified: Secondary | ICD-10-CM

## 2018-03-11 DIAGNOSIS — M79672 Pain in left foot: Secondary | ICD-10-CM

## 2018-03-11 DIAGNOSIS — M778 Other enthesopathies, not elsewhere classified: Secondary | ICD-10-CM

## 2018-03-11 MED ORDER — TRIAMCINOLONE ACETONIDE 10 MG/ML IJ SUSP
10.0000 mg | Freq: Once | INTRAMUSCULAR | Status: AC
  Administered 2018-03-11: 10 mg

## 2018-03-14 NOTE — Progress Notes (Signed)
Subjective:   Patient ID: Dawn Porter, female   DOB: 27 y.o.   MRN: [id number]   HPI Patient presents stating I am better than it was previously but I still have discomfort and it seems like it moved a little bit and I do not get the burning pain I used to get   ROS      Objective:  Physical Exam  Neurovascular status intact with incision is healed well dorsally toes with good alignment and motion with no pain in the third MPJ currently.  The pain seems to be centered around the fourth MPJ left with no current neuroma symptomatology that was apparent     Assessment:  Probability that there may be a low-grade inflammatory process fourth MPJ left with patient who did have a very difficult preoperative problem     Plan:  H&P condition reviewed and recommended draining the fourth MPJ and today I did proximal nerve block sterile prep applied and using sterile instrumentation injection I did aspirate the joint getting out a small amount of clear fluid and injected with quarter cc dexamethasone Kenalog and applied padding to take pressure off the joint surface.  Patient will be rechecked again in 3 weeks  X-ray indicates that there is no signs of stress fracture arthritis with screw in place third metatarsal

## 2018-03-16 ENCOUNTER — Encounter: Payer: Self-pay | Admitting: Obstetrics & Gynecology

## 2018-03-16 ENCOUNTER — Other Ambulatory Visit: Payer: Self-pay | Admitting: Obstetrics & Gynecology

## 2018-03-16 ENCOUNTER — Ambulatory Visit (INDEPENDENT_AMBULATORY_CARE_PROVIDER_SITE_OTHER): Payer: BLUE CROSS/BLUE SHIELD | Admitting: Obstetrics & Gynecology

## 2018-03-16 VITALS — BP 126/84

## 2018-03-16 DIAGNOSIS — Z30430 Encounter for insertion of intrauterine contraceptive device: Secondary | ICD-10-CM | POA: Diagnosis not present

## 2018-03-16 MED ORDER — LORAZEPAM 0.5 MG PO TABS: 1.0000 mg | ORAL_TABLET | Freq: Three times a day (TID) | ORAL | 0 refills | Status: AC

## 2018-03-16 NOTE — Patient Instructions (Addendum)
1. Encounter for IUD insertion Skyla IUD insertion counseling done.  Easy IUD insertion without complication.  Well-tolerated by the patient.  Follow-up in 4 weeks to check placement and confirm no infection.  Post IUD insertion precautions reviewed.  Dawn Porter, it was a pleasure meeting you today!  IUD PLACEMENT POST-PROCEDURE INSTRUCTIONS  1. You may take Ibuprofen, Aleve or Tylenol for pain if needed.  Cramping should resolve within in 24 hours.  2. You may have a small amount of spotting.  You should wear a mini pad for the next few days.  3. You may have intercourse after 24 hours.  If you using this for birth control, it is effective immediately.  4. You need to call if you have any pelvic pain, fever, heavy bleeding or foul smelling vaginal discharge.  Irregular bleeding is common the first several months after having an IUD placed. You do not need to call for this reason unless you are concerned.  5. Shower or bathe as normal  6. You should have a follow-up appointment in 4-8 weeks for a re-check to make sure you are not having any problems.
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
## 2018-03-17 DIAGNOSIS — F603 Borderline personality disorder: Secondary | ICD-10-CM | POA: Diagnosis not present

## 2018-03-24 ENCOUNTER — Ambulatory Visit: Payer: BLUE CROSS/BLUE SHIELD | Admitting: Obstetrics & Gynecology

## 2018-03-25 DIAGNOSIS — F603 Borderline personality disorder: Secondary | ICD-10-CM | POA: Diagnosis not present

## 2018-03-28 ENCOUNTER — Encounter: Payer: Self-pay | Admitting: Anesthesiology

## 2018-03-31 DIAGNOSIS — F603 Borderline personality disorder: Secondary | ICD-10-CM | POA: Diagnosis not present

## 2018-04-07 DIAGNOSIS — F603 Borderline personality disorder: Secondary | ICD-10-CM | POA: Diagnosis not present

## 2018-04-08 ENCOUNTER — Encounter: Payer: Self-pay | Admitting: Podiatry

## 2018-04-08 ENCOUNTER — Ambulatory Visit: Payer: BLUE CROSS/BLUE SHIELD | Admitting: Podiatry

## 2018-04-08 DIAGNOSIS — M779 Enthesopathy, unspecified: Secondary | ICD-10-CM | POA: Diagnosis not present

## 2018-04-08 DIAGNOSIS — M778 Other enthesopathies, not elsewhere classified: Secondary | ICD-10-CM

## 2018-04-11 NOTE — Progress Notes (Signed)
Subjective:   Patient ID: Dawn Porter, female   DOB: 27 y.o.   MRN: [id number]   HPI Patient presents stating I am improved but I am still having some pain and it seems like I have made some progress   ROS      Objective:  Physical Exam  Neurovascular status intact with discomfort fourth MPJ left which is improved but present     Assessment:  Improving from tendinitis with mild discomfort around the fourth MPJ     Plan:  Discussed continued orthotic usage anti-inflammatory rigid bottom shoes and reappoint as needed

## 2018-04-14 ENCOUNTER — Ambulatory Visit: Payer: BLUE CROSS/BLUE SHIELD | Admitting: Obstetrics & Gynecology

## 2018-04-14 ENCOUNTER — Encounter: Payer: Self-pay | Admitting: Obstetrics & Gynecology

## 2018-04-14 VITALS — BP 118/76

## 2018-04-14 DIAGNOSIS — F603 Borderline personality disorder: Secondary | ICD-10-CM | POA: Diagnosis not present

## 2018-04-14 DIAGNOSIS — B3731 Acute candidiasis of vulva and vagina: Secondary | ICD-10-CM

## 2018-04-14 DIAGNOSIS — B373 Candidiasis of vulva and vagina: Secondary | ICD-10-CM

## 2018-04-14 DIAGNOSIS — Z30431 Encounter for routine checking of intrauterine contraceptive device: Secondary | ICD-10-CM | POA: Diagnosis not present

## 2018-04-14 MED ORDER — FLUCONAZOLE 150 MG PO TABS: 150.0000 mg | ORAL_TABLET | Freq: Every day | ORAL | 1 refills | Status: AC

## 2018-04-14 NOTE — Patient Instructions (Signed)
1. IUD check up Skyla IUD well-tolerated and in good position.  No sign of infection.  2. Yeast vaginitis Yeast vaginitis clinically and on exam.  Decision to treat with fluconazole 150 mg/tab, 1 tablet daily for 3 days.  Usage reviewed with patient and prescription sent to pharmacy.  Other orders - fluconazole (DIFLUCAN) 150 MG tablet; Take 1 tablet (150 mg total) by mouth daily for 3 days.  Jess, good seeing you today!

## 2018-04-14 NOTE — Progress Notes (Signed)
    Dawn Porter 12-Apr-1991 [id number]        27 y.o.  G0 Single.  Studying  RP: 4 wks post insertion Skyla IUD check  HPI: Well since [place] IUD insertion on March 16, 2018.  Occasional uterine cramping, but improving.  Had a normal menstrual period at the time of insertion.  Mildly increased vaginal discharge with itching.  No fever.   OB History  Gravida Para Term Preterm AB Living  0 0 0 0 0 0  SAB TAB Ectopic Multiple Live Births  0 0 0 0 0    Past medical history,surgical history, problem list, medications, allergies, family history and social history were all reviewed and documented in the EPIC chart.   Directed ROS with pertinent positives and negatives documented in the history of present illness/assessment and plan.  Exam:  Vitals:   04/14/18 1540  BP: 118/76   General appearance:  Normal  Abdomen: Normal  Gynecologic exam: Vulva normal.  Speculum: Cervix normal.  IUD string visible at the exocervix.  Vagina normal.  Increased vaginal discharge compatible with yeast.   Assessment/Plan:  27 y.o. G0  1. IUD check up Skyla IUD well-tolerated and in good position.  No sign of infection.  2. Yeast vaginitis Yeast vaginitis clinically and on exam.  Decision to treat with fluconazole 150 mg/tab, 1 tablet daily for 3 days.  Usage reviewed with patient and prescription sent to pharmacy.  Other orders - fluconazole (DIFLUCAN) 150 MG tablet; Take 1 tablet (150 mg total) by mouth daily for 3 days.  Counseling on above issues and coordination of care more than 50% for 15 minutes.  [name] MD, 3:42 PM 04/14/2018

## 2018-04-18 ENCOUNTER — Ambulatory Visit (INDEPENDENT_AMBULATORY_CARE_PROVIDER_SITE_OTHER): Payer: BLUE CROSS/BLUE SHIELD | Admitting: Orthotics

## 2018-04-18 DIAGNOSIS — M7742 Metatarsalgia, left foot: Secondary | ICD-10-CM

## 2018-04-18 DIAGNOSIS — M778 Other enthesopathies, not elsewhere classified: Secondary | ICD-10-CM

## 2018-04-18 DIAGNOSIS — D3610 Benign neoplasm of peripheral nerves and autonomic nervous system, unspecified: Secondary | ICD-10-CM

## 2018-04-18 DIAGNOSIS — M79672 Pain in left foot: Secondary | ICD-10-CM

## 2018-04-18 DIAGNOSIS — M779 Enthesopathy, unspecified: Secondary | ICD-10-CM | POA: Diagnosis not present

## 2018-04-18 DIAGNOSIS — G5762 Lesion of plantar nerve, left lower limb: Secondary | ICD-10-CM

## 2018-04-18 NOTE — Progress Notes (Signed)
Ordering second pair of f/o; this time dress to go in boots. Also offloading 4th mpj left and adding small neuroma pad; top cover fwd unglued.

## 2018-04-19 DIAGNOSIS — F603 Borderline personality disorder: Secondary | ICD-10-CM | POA: Diagnosis not present

## 2018-04-29 ENCOUNTER — Telehealth: Payer: BLUE CROSS/BLUE SHIELD | Admitting: Nurse Practitioner

## 2018-04-29 DIAGNOSIS — S80869A Insect bite (nonvenomous), unspecified lower leg, initial encounter: Secondary | ICD-10-CM

## 2018-04-29 DIAGNOSIS — R112 Nausea with vomiting, unspecified: Secondary | ICD-10-CM

## 2018-04-29 DIAGNOSIS — W57XXXA Bitten or stung by nonvenomous insect and other nonvenomous arthropods, initial encounter: Secondary | ICD-10-CM

## 2018-04-29 MED ORDER — DOXYCYCLINE HYCLATE 100 MG PO TABS: 100.0000 mg | ORAL_TABLET | Freq: Two times a day (BID) | ORAL | 0 refills | Status: DC

## 2018-04-29 NOTE — Progress Notes (Signed)
E Visit for Insect Sting  Thank you for describing the insect sting for [place].  Here is how we plan to help!  An infected or complicated stinger that requires you to be seen in a face to face visit with a provider today.  The 2 greatest risks from insect stings are allergic reaction, which can be fatal in some people and infection, which is more common and less serious.  Bees, wasps, yellow jackets, and hornets belong to a class of insects called Hymenoptera.  Most insect stings cause only minor discomfort.  Stings can happen anywhere on the body and can be painful.  Most stings are from honey bees or yellow jackets.  Fire ants can sting multiple times.  The sites of the stings are more likely to become infected.    Provided a home care guide for insect stings and instructions on when to call for help. I have also sentin doxycycline 100mg  1 po bid   What can be used to prevent Insect Stings?   Insect repellant with at least 20% DEET.    Wearing long pants and shirts with socks and shoes.    Wear dark or drab-colored clothes rather than bright colors.    Avoid using perfumes and hair sprays; these attract insects.  HOME CARE ADVICE:  1. Stinger removal:  The stinger looks like a tiny black dot in the sting.  Use a fingernail, credit card edge, or knife-edge to scrape it off.  Don't pull it out because it squeezes out more venom.  If the stinger is below the skin surface, leave it alone.  It will be shed with normal skin healing. 2. Use cold compresses to the area of the sting for 10-20 minutes.  You may repeat this as needed to relieve symptoms of pain and swelling. 3.  For pain relief, take acetominophen 650 mg 4-6 hours as needed or ibuprofen 400 mg every 6-8 hours as needed or naproxen 250-500 mg every 12 hours as needed. 4.  You can also use hydrocortisone cream 0.5% or 1% up to 4 times daily as needed for itching. 5.  If the sting becomes very itchy, take Benadryl 25-50 mg,  follow directions on box. 6.  Wash the area 2-3 times daily with antibacterial soap and warm water. 7. Call your Doctor if:  Fever, a severe headache, or rash occur in the next 2 weeks.  Sting area begins to look infected.  Redness and swelling worsens after home treatment.  Your current symptoms become worse.    MAKE SURE YOU:   Understand these instructions.  Will watch your condition.  Will get help right away if you are not doing well or get worse.  Thank you for choosing an e-visit. Your e-visit answers were reviewed by a board certified advanced clinical practitioner to complete your personal care plan. Depending upon the condition, your plan could have included both over the counter or prescription medications. Please review your pharmacy choice. Be sure that the pharmacy you have chosen is open so that you can pick up your prescription now.  If there is a problem you may message your provider in [place] to have the prescription routed to another pharmacy. Your safety is important to [place]. If you have drug allergies check your prescription carefully.  For the next 24 hours, you can use MyChart to ask questions about today's visit, request a non-urgent call back, or ask for a work or school excuse from your e-visit provider. You will get  an email in the next two days asking about your experience. I hope that your e-visit has been valuable and will speed your recovery.    

## 2018-05-05 NOTE — Progress Notes (Signed)
Based on what you shared with me it looks like you have a condition that should be evaluated in a face to face office visit.   You should be on day 7 of the antibiotics and the affected area virtually resolved. If this is the case you can stop taking doxycycline at day 7; today. Hopefully, stopping the antibiotic can relieve your symptoms, if you could be experiencing nausea for another reason best to have this evaluated face to face. If the area of your skin where your experienced the bite is still unimproved, you should be seen face to face due the the length of time the bite has been bothersome and the previous description of the skin " turning black".  See face to face options below, I hope this is helpful.  NOTE: If you entered your credit card information for this eVisit, you will not be charged. You may see a "hold" on your card for the $30 but that hold will drop off and you will not have a charge processed.  If you are having a true medical emergency please call 911.  If you need an urgent face to face visit, [facility] has four urgent care centers for your convenience.  If you need care fast and have a high deductible or no insurance consider:   [email or web address] to reserve your spot online an avoid wait times  [facility] [address], [address] [place], [place] [address] 8 am to 8 pm Monday-Friday 10 am to 4 pm Saturday-Sunday *Across the street from [employer]  [place], [address] 8 am to 5 pm Monday-Friday * In the [facility] on the [facility]   The following sites will take your  insurance:  . [facility] Health Urgent [place] a Provider at this Location  [address] [place], [place] [address] . 10 am to 8 pm Monday-Friday . 12 pm to 8 pm Saturday-Sunday   . [facility] Health Urgent Care at [place] a Provider at this Location  [address], [place], [place] [address] . 8 am to 8 pm Monday-Friday . 9 am to 6 pm Saturday . 11 am to 6 pm Sunday   . [facility] Health Urgent Care at [place] Get Driving Directions  [address] Suite [place], [place] [address] . 8 am to 8 pm Monday-Friday . 8 am to 4 pm Saturday-Sunday   Your e-visit answers were reviewed by a board certified advanced clinical practitioner to complete your personal care plan.  Thank you for using e-Visits.

## 2018-05-06 ENCOUNTER — Encounter (HOSPITAL_COMMUNITY): Payer: Self-pay | Admitting: Emergency Medicine

## 2018-05-06 ENCOUNTER — Ambulatory Visit (HOSPITAL_COMMUNITY)
Admission: EM | Admit: 2018-05-06 | Discharge: 2018-05-06 | Disposition: A | Discharge status: Home / Self Care | Payer: BLUE CROSS/BLUE SHIELD | Attending: Family Medicine | Admitting: Family Medicine

## 2018-05-06 DIAGNOSIS — L089 Local infection of the skin and subcutaneous tissue, unspecified: Secondary | ICD-10-CM

## 2018-05-06 DIAGNOSIS — W57XXXD Bitten or stung by nonvenomous insect and other nonvenomous arthropods, subsequent encounter: Secondary | ICD-10-CM

## 2018-05-06 DIAGNOSIS — F603 Borderline personality disorder: Secondary | ICD-10-CM | POA: Diagnosis not present

## 2018-05-06 DIAGNOSIS — S30860D Insect bite (nonvenomous) of lower back and pelvis, subsequent encounter: Secondary | ICD-10-CM

## 2018-05-06 DIAGNOSIS — R112 Nausea with vomiting, unspecified: Secondary | ICD-10-CM | POA: Diagnosis not present

## 2018-05-06 MED ORDER — ONDANSETRON HCL 4 MG PO TABS: 4.0000 mg | ORAL_TABLET | Freq: Three times a day (TID) | ORAL | 0 refills | Status: DC | PRN

## 2018-05-06 NOTE — ED Triage Notes (Signed)
Pt c/o insect bite on her bottom 2 weeks ago, pt had pain, did the E provider visit, given doxy Monday, pt states ever since the bite shes had nausea, but after she started taking the doxy she started vomiting. States the doxy is clearing up the bite but it makes her sick.

## 2018-05-06 NOTE — Discharge Instructions (Signed)
Continue with doxycycline.  Take with food, but avoid taking with products containing calcium or iron such as dairy products as this may affect the absorption of this medication Get rest and drink fluids Zofran prescribed.  Take as needed for nausea. Follow up with PCP if symptoms persists If you experience new or worsening symptoms return or go to ER such as fever, chills, increased redness, swelling or pain at insect bite site, nausea, vomiting, diarrhea, bloody or dark tarry stools, constipation, urinary symptoms, worsening abdominal discomfort, symptoms that do not improve with medications, inability to keep fluids down, etc..Marland Kitchen
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
## 2018-05-09 ENCOUNTER — Ambulatory Visit: Payer: BLUE CROSS/BLUE SHIELD | Admitting: Orthotics

## 2018-05-09 ENCOUNTER — Telehealth: Payer: BLUE CROSS/BLUE SHIELD | Admitting: Family

## 2018-05-09 DIAGNOSIS — G5762 Lesion of plantar nerve, left lower limb: Secondary | ICD-10-CM

## 2018-05-09 DIAGNOSIS — M7742 Metatarsalgia, left foot: Secondary | ICD-10-CM

## 2018-05-09 DIAGNOSIS — M778 Other enthesopathies, not elsewhere classified: Secondary | ICD-10-CM

## 2018-05-09 DIAGNOSIS — M546 Pain in thoracic spine: Secondary | ICD-10-CM

## 2018-05-09 DIAGNOSIS — M779 Enthesopathy, unspecified: Principal | ICD-10-CM

## 2018-05-09 MED ORDER — NAPROXEN 500 MG PO TABS: 500.0000 mg | ORAL_TABLET | Freq: Two times a day (BID) | ORAL | 0 refills | Status: DC

## 2018-05-09 MED ORDER — BACLOFEN 10 MG PO TABS: 10.0000 mg | ORAL_TABLET | Freq: Three times a day (TID) | ORAL | 0 refills | Status: DC

## 2018-05-09 NOTE — Progress Notes (Signed)
Patient came in today to pick up custom made foot orthotics.  The goals were accomplished and the patient reported no dissatisfaction with said orthotics.  Patient was advised of breakin period and how to report any issues. 

## 2018-05-09 NOTE — Progress Notes (Signed)

## 2018-05-11 ENCOUNTER — Encounter: Payer: Self-pay | Admitting: Internal Medicine

## 2018-05-11 ENCOUNTER — Ambulatory Visit: Payer: BLUE CROSS/BLUE SHIELD | Admitting: Internal Medicine

## 2018-05-11 VITALS — BP 100/68 | HR 91 | Temp 97.5°F | Ht 64.0 in | Wt 148.0 lb

## 2018-05-11 DIAGNOSIS — M542 Cervicalgia: Secondary | ICD-10-CM | POA: Diagnosis not present

## 2018-05-11 MED ORDER — PREDNISONE 20 MG PO TABS: 40.0000 mg | ORAL_TABLET | Freq: Every day | ORAL | 0 refills | Status: DC

## 2018-05-11 MED ORDER — METAXALONE 800 MG PO TABS: 800.0000 mg | ORAL_TABLET | Freq: Three times a day (TID) | ORAL | 3 refills | Status: DC

## 2018-05-11 NOTE — Progress Notes (Signed)
   Subjective:    Patient ID: Dawn Porter, female    DOB: 1990/12/15, 27 y.o.   MRN: [id number]  HPI The patient is a 27 YO female coming in for back and neck pain. Started years ago due to falling off horse several times. It is worsening lately. Is taking baclofen and robaxin and ibuprofen which help some but not a lot. Mostly in her neck but the whole back hurts. She denies new numbness or weakness. No urinary changes. She cannot recall any injury or overuse at the onset of the worsening recently. 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 Overall it is worse and stable since the worsening from baseline. Denies weight change or fevers or chills. Has seen chiropractor and PT in the past and had to stop due to cost.   Review of Systems  Constitutional: Positive for activity change. Negative for appetite change, chills, fatigue, fever and unexpected weight change.  Respiratory: Negative.   Cardiovascular: Negative.   Gastrointestinal: Negative.   Musculoskeletal: Positive for arthralgias, back pain, myalgias, neck pain and neck stiffness. Negative for gait problem and joint swelling.  Skin: Negative.   Neurological: Negative.       Objective:   Physical Exam  Constitutional: She is oriented to person, place, and time. She appears well-developed and well-nourished.  HENT:  Head: Normocephalic and atraumatic.  Eyes: EOM are normal.  Neck: Normal range of motion.  Cardiovascular: Normal rate and regular rhythm.  Pulmonary/Chest: Effort normal and breath sounds normal. No respiratory distress. She has no wheezes. She has no rales.  Abdominal: Soft. Bowel sounds are normal. She exhibits no distension. There is no tenderness. There is no rebound.  Musculoskeletal: She exhibits tenderness. She exhibits no edema.  Pain cervical midline with tenderness paraspinal as well and scapular bilateral  Neurological: She is alert and oriented to person, place, and  time. Coordination normal.  Skin: Skin is warm and dry.   Vitals:   05/11/18 0958  BP: 100/68  Pulse: 91  Temp: (!) 97.5 F (36.4 C)  TempSrc: Oral  SpO2: 97%  Weight: 148 lb (67.1 kg)  Height: 5\' 4"  (1.626 m)      Assessment & Plan:

## 2018-05-11 NOTE — Patient Instructions (Signed)
We have sent in prednisone to take 2 pills daily for 5 days.  We have also sent in skelaxin to use for pain if needed.

## 2018-05-11 NOTE — Assessment & Plan Note (Signed)
This is a chronic problem and rx for skelaxin which she has taken in the past and tolerated well. Referral to PT so they can teach her exercises to use long term for maintenance. Rx for prednisone burst to see if this can help the pain get back to normal levels.

## 2018-05-16 ENCOUNTER — Encounter: Payer: Self-pay | Admitting: Podiatry

## 2018-05-16 ENCOUNTER — Ambulatory Visit: Payer: BLUE CROSS/BLUE SHIELD | Admitting: Podiatry

## 2018-05-16 DIAGNOSIS — M7752 Other enthesopathy of left foot: Secondary | ICD-10-CM | POA: Diagnosis not present

## 2018-05-16 DIAGNOSIS — M779 Enthesopathy, unspecified: Principal | ICD-10-CM

## 2018-05-16 DIAGNOSIS — M778 Other enthesopathies, not elsewhere classified: Secondary | ICD-10-CM

## 2018-05-16 MED ORDER — TRIAMCINOLONE ACETONIDE 10 MG/ML IJ SUSP
10.0000 mg | Freq: Once | INTRAMUSCULAR | Status: AC
  Administered 2018-05-16: 10 mg

## 2018-05-19 DIAGNOSIS — M542 Cervicalgia: Secondary | ICD-10-CM | POA: Diagnosis not present

## 2018-05-19 DIAGNOSIS — M549 Dorsalgia, unspecified: Secondary | ICD-10-CM | POA: Diagnosis not present

## 2018-05-19 DIAGNOSIS — M5136 Other intervertebral disc degeneration, lumbar region: Secondary | ICD-10-CM | POA: Diagnosis not present

## 2018-05-19 DIAGNOSIS — M50123 Cervical disc disorder at C6-C7 level with radiculopathy: Secondary | ICD-10-CM | POA: Diagnosis not present

## 2018-05-19 DIAGNOSIS — F603 Borderline personality disorder: Secondary | ICD-10-CM | POA: Diagnosis not present

## 2018-05-22 NOTE — Progress Notes (Signed)
Subjective:   Patient ID: Dawn Porter, female   DOB: 27 y.o.   MRN: [id number]   HPI Patient states she is getting ready to go out of town and she is improving but she has one area there.  It hurts and it is moved over closer to her fifth metatarsal   ROS      Objective:  Physical Exam  Neurovascular status intact with patient's third and fourth MPJ doing much better with minimal discomfort with the fourth interspace and fifth MPJ being tender when palpated     Assessment:  Inflammatory capsulitis fifth MPJ fourth interspace left foot with pain     Plan:  H&P condition reviewed and today I did careful injection fifth MPJ 3 mg Kenalog 5 mg Xylocaine and advised on continued supportive shoes orthotic usage and patient will be seen back when she returns from her trip

## 2018-05-30 DIAGNOSIS — M545 Low back pain: Secondary | ICD-10-CM | POA: Diagnosis not present

## 2018-05-30 DIAGNOSIS — M6281 Muscle weakness (generalized): Secondary | ICD-10-CM | POA: Diagnosis not present

## 2018-05-30 DIAGNOSIS — G8921 Chronic pain due to trauma: Secondary | ICD-10-CM | POA: Diagnosis not present

## 2018-05-30 DIAGNOSIS — M5442 Lumbago with sciatica, left side: Secondary | ICD-10-CM | POA: Diagnosis not present

## 2018-06-01 DIAGNOSIS — M6281 Muscle weakness (generalized): Secondary | ICD-10-CM | POA: Diagnosis not present

## 2018-06-01 DIAGNOSIS — M545 Low back pain: Secondary | ICD-10-CM | POA: Diagnosis not present

## 2018-06-01 DIAGNOSIS — M5442 Lumbago with sciatica, left side: Secondary | ICD-10-CM | POA: Diagnosis not present

## 2018-06-01 DIAGNOSIS — G8921 Chronic pain due to trauma: Secondary | ICD-10-CM | POA: Diagnosis not present

## 2018-06-06 DIAGNOSIS — M5442 Lumbago with sciatica, left side: Secondary | ICD-10-CM | POA: Diagnosis not present

## 2018-06-06 DIAGNOSIS — G8921 Chronic pain due to trauma: Secondary | ICD-10-CM | POA: Diagnosis not present

## 2018-06-06 DIAGNOSIS — M6281 Muscle weakness (generalized): Secondary | ICD-10-CM | POA: Diagnosis not present

## 2018-06-06 DIAGNOSIS — M545 Low back pain: Secondary | ICD-10-CM | POA: Diagnosis not present

## 2018-06-09 DIAGNOSIS — M5442 Lumbago with sciatica, left side: Secondary | ICD-10-CM | POA: Diagnosis not present

## 2018-06-09 DIAGNOSIS — M6281 Muscle weakness (generalized): Secondary | ICD-10-CM | POA: Diagnosis not present

## 2018-06-09 DIAGNOSIS — M545 Low back pain: Secondary | ICD-10-CM | POA: Diagnosis not present

## 2018-06-09 DIAGNOSIS — G8921 Chronic pain due to trauma: Secondary | ICD-10-CM | POA: Diagnosis not present

## 2018-06-13 ENCOUNTER — Telehealth: Payer: BLUE CROSS/BLUE SHIELD | Admitting: Family

## 2018-06-13 DIAGNOSIS — N898 Other specified noninflammatory disorders of vagina: Secondary | ICD-10-CM

## 2018-06-13 DIAGNOSIS — G8921 Chronic pain due to trauma: Secondary | ICD-10-CM | POA: Diagnosis not present

## 2018-06-13 DIAGNOSIS — M545 Low back pain: Secondary | ICD-10-CM | POA: Diagnosis not present

## 2018-06-13 DIAGNOSIS — R109 Unspecified abdominal pain: Secondary | ICD-10-CM

## 2018-06-13 DIAGNOSIS — M5442 Lumbago with sciatica, left side: Secondary | ICD-10-CM | POA: Diagnosis not present

## 2018-06-13 DIAGNOSIS — M6281 Muscle weakness (generalized): Secondary | ICD-10-CM | POA: Diagnosis not present

## 2018-06-13 NOTE — Progress Notes (Signed)
Based on what you shared with me it looks like you have a serious condition that should be evaluated in a face to face office visit.  NOTE: If you entered your credit card information for this eVisit, you will not be charged. You may see a "hold" on your card for the $30 but that hold will drop off and you will not have a charge processed.  Given your symptoms with belly and abdomen pain, I feel like you need to be seen in person. Hope you feel better!   If you are having a true medical emergency please call 911.  If you need an urgent face to face visit, [facility] has four urgent care centers for your convenience.  If you need care fast and have a high deductible or no insurance consider:    [email or web address] to reserve your spot online an avoid wait times  [facility] [address] [place], [place] [address] 8 am to 8 pm Monday-Friday 10 am to 4 pm Saturday-Sunday *Across the street from [employer]  [place], [address] 8 am to 5 pm Monday-Friday * In the [facility] on the [facility]   The following sites will take your  insurance:  . [facility] Health Urgent [place] a Provider at this Location  [address] [place], [place] [address] . 10 am to 8 pm Monday-Friday . 12 pm to 8 pm Saturday-Sunday   . [facility] Health Urgent Care at [place] a Provider at this Location  [address] [place], [place], [place] [address] . 8 am to 8 pm Monday-Friday . 9 am to 6 pm Saturday . 11 am to 6 pm Sunday   . [facility] Health Urgent Care at [place] Get Driving Directions  [address] [place], [place] [address] . 8 am to 8 pm Monday-Friday . 8 am to 4 pm Saturday-Sunday   Your e-visit answers were reviewed by a board certified advanced clinical practitioner to  complete your personal care plan.  Thank you for using e-Visits.

## 2018-06-17 DIAGNOSIS — G8921 Chronic pain due to trauma: Secondary | ICD-10-CM | POA: Diagnosis not present

## 2018-06-17 DIAGNOSIS — M6281 Muscle weakness (generalized): Secondary | ICD-10-CM | POA: Diagnosis not present

## 2018-06-17 DIAGNOSIS — M545 Low back pain: Secondary | ICD-10-CM | POA: Diagnosis not present

## 2018-06-17 DIAGNOSIS — M5442 Lumbago with sciatica, left side: Secondary | ICD-10-CM | POA: Diagnosis not present

## 2018-06-20 DIAGNOSIS — M6281 Muscle weakness (generalized): Secondary | ICD-10-CM | POA: Diagnosis not present

## 2018-06-20 DIAGNOSIS — M5442 Lumbago with sciatica, left side: Secondary | ICD-10-CM | POA: Diagnosis not present

## 2018-06-20 DIAGNOSIS — G8921 Chronic pain due to trauma: Secondary | ICD-10-CM | POA: Diagnosis not present

## 2018-06-20 DIAGNOSIS — M545 Low back pain: Secondary | ICD-10-CM | POA: Diagnosis not present

## 2018-06-21 ENCOUNTER — Ambulatory Visit (INDEPENDENT_AMBULATORY_CARE_PROVIDER_SITE_OTHER): Payer: BLUE CROSS/BLUE SHIELD | Admitting: Obstetrics & Gynecology

## 2018-06-21 ENCOUNTER — Encounter: Payer: Self-pay | Admitting: Obstetrics & Gynecology

## 2018-06-21 DIAGNOSIS — F4312 Post-traumatic stress disorder, chronic: Secondary | ICD-10-CM | POA: Diagnosis not present

## 2018-06-21 DIAGNOSIS — N898 Other specified noninflammatory disorders of vagina: Secondary | ICD-10-CM

## 2018-06-21 DIAGNOSIS — B3731 Acute candidiasis of vulva and vagina: Secondary | ICD-10-CM

## 2018-06-21 DIAGNOSIS — B373 Candidiasis of vulva and vagina: Secondary | ICD-10-CM

## 2018-06-21 DIAGNOSIS — F603 Borderline personality disorder: Secondary | ICD-10-CM | POA: Diagnosis not present

## 2018-06-21 LAB — WET PREP FOR TRICH, YEAST, CLUE

## 2018-06-21 MED ORDER — FLUCONAZOLE 150 MG PO TABS: 150.0000 mg | ORAL_TABLET | Freq: Every day | ORAL | 2 refills | Status: AC

## 2018-06-21 NOTE — Patient Instructions (Signed)
1. Vaginal discharge Yeast vaginitis clinically and confirmed by wet prep showing yeast.  Will treat with fluconazole. - WET PREP FOR TRICH, YEAST, CLUE  2. Yeast vaginitis Yeast vaginitis confirmed by wet prep.  Treat with fluconazole 150 mg 1 tablet per mouth daily for 3 days.  Recommend probiotic for prevention.  Will use probiotic tablet vaginally once a week as needed.  Also recommend boric acid over-the-counter as needed.  Sent more tablets of fluconazole and refills as [place].  Other orders - pregabalin (LYRICA) 50 MG capsule; Take 50 mg by mouth 2 (two) times daily. - fluconazole (DIFLUCAN) 150 MG tablet; Take 1 tablet (150 mg total) by mouth daily for 14 days.  Dawn Porter, it was a pleasure seeing you today!

## 2018-06-21 NOTE — Progress Notes (Signed)
    Dawn Porter [birth year] [id number]        28 y.o.  G0 Single  RP: Vaginal discharge with itching and burning x 2 weeks  HPI: Well on Skyla IUD x 03/2018.  Mild occasional menses.  No pelvic pain.  Abstinent.  C/O vaginal discharge with itching and burning x 2 weeks.  Tried Monistat with no resolution of symptoms.   OB History  Gravida Para Term Preterm AB Living  0 0 0 0 0 0  SAB TAB Ectopic Multiple Live Births  0 0 0 0 0    Past medical history,surgical history, problem list, medications, allergies, family history and social history were all reviewed and documented in the EPIC chart.   Directed ROS with pertinent positives and negatives documented in the history of present illness/assessment and plan.  Exam:  There were no vitals filed for this visit. General appearance:  Normal  Gynecologic exam: Vulva normal.  Speculum: Cervix normal.  IUD strings visible.  Large amounts of thick vaginal discharge.  Wet prep done.  Wet prep: Yeasts present   Assessment/Plan:  28 y.o. G0  1. Vaginal discharge Yeast vaginitis clinically and confirmed by wet prep showing yeast.  Will treat with fluconazole. - WET PREP FOR TRICH, YEAST, CLUE  2. Yeast vaginitis Yeast vaginitis confirmed by wet prep.  Treat with fluconazole 150 mg 1 tablet per mouth daily for 3 days.  Recommend probiotic for prevention.  Will use probiotic tablet vaginally once a week as needed.  Also recommend boric acid over-the-counter as needed.  Sent more tablets of fluconazole and refills as patient will travel abroad for 6 months.  Other orders - pregabalin (LYRICA) 50 MG capsule; Take 50 mg by mouth 2 (two) times daily. - fluconazole (DIFLUCAN) 150 MG tablet; Take 1 tablet (150 mg total) by mouth daily for 14 days.  Counseling on above issues and coordination of care more than 50% for 15 minutes.  [name] MD, 2:37 PM 06/21/2018

## 2018-06-24 DIAGNOSIS — M6281 Muscle weakness (generalized): Secondary | ICD-10-CM | POA: Diagnosis not present

## 2018-06-24 DIAGNOSIS — M5442 Lumbago with sciatica, left side: Secondary | ICD-10-CM | POA: Diagnosis not present

## 2018-06-24 DIAGNOSIS — G8921 Chronic pain due to trauma: Secondary | ICD-10-CM | POA: Diagnosis not present

## 2018-06-24 DIAGNOSIS — M545 Low back pain: Secondary | ICD-10-CM | POA: Diagnosis not present

## 2018-06-26 ENCOUNTER — Telehealth: Payer: BLUE CROSS/BLUE SHIELD | Admitting: Physician Assistant

## 2018-06-26 DIAGNOSIS — R300 Dysuria: Secondary | ICD-10-CM | POA: Diagnosis not present

## 2018-06-26 MED ORDER — CEPHALEXIN 500 MG PO CAPS: 500.0000 mg | ORAL_CAPSULE | Freq: Two times a day (BID) | ORAL | 0 refills | Status: AC

## 2018-06-26 NOTE — Progress Notes (Signed)

## 2018-06-29 DIAGNOSIS — F603 Borderline personality disorder: Secondary | ICD-10-CM | POA: Diagnosis not present

## 2018-06-29 DIAGNOSIS — M6281 Muscle weakness (generalized): Secondary | ICD-10-CM | POA: Diagnosis not present

## 2018-06-29 DIAGNOSIS — M5442 Lumbago with sciatica, left side: Secondary | ICD-10-CM | POA: Diagnosis not present

## 2018-06-29 DIAGNOSIS — G8921 Chronic pain due to trauma: Secondary | ICD-10-CM | POA: Diagnosis not present

## 2018-06-29 DIAGNOSIS — M545 Low back pain: Secondary | ICD-10-CM | POA: Diagnosis not present

## 2018-06-30 DIAGNOSIS — M50123 Cervical disc disorder at C6-C7 level with radiculopathy: Secondary | ICD-10-CM | POA: Diagnosis not present

## 2018-06-30 DIAGNOSIS — F603 Borderline personality disorder: Secondary | ICD-10-CM | POA: Diagnosis not present

## 2018-06-30 DIAGNOSIS — M5136 Other intervertebral disc degeneration, lumbar region: Secondary | ICD-10-CM | POA: Diagnosis not present

## 2018-06-30 DIAGNOSIS — M6281 Muscle weakness (generalized): Secondary | ICD-10-CM | POA: Diagnosis not present

## 2018-06-30 DIAGNOSIS — M545 Low back pain: Secondary | ICD-10-CM | POA: Diagnosis not present

## 2018-06-30 DIAGNOSIS — M5442 Lumbago with sciatica, left side: Secondary | ICD-10-CM | POA: Diagnosis not present

## 2018-06-30 DIAGNOSIS — F4312 Post-traumatic stress disorder, chronic: Secondary | ICD-10-CM | POA: Diagnosis not present

## 2018-06-30 DIAGNOSIS — G8921 Chronic pain due to trauma: Secondary | ICD-10-CM | POA: Diagnosis not present

## 2018-07-04 DIAGNOSIS — M5442 Lumbago with sciatica, left side: Secondary | ICD-10-CM | POA: Diagnosis not present

## 2018-07-04 DIAGNOSIS — M545 Low back pain: Secondary | ICD-10-CM | POA: Diagnosis not present

## 2018-07-04 DIAGNOSIS — M6281 Muscle weakness (generalized): Secondary | ICD-10-CM | POA: Diagnosis not present

## 2018-07-04 DIAGNOSIS — G8921 Chronic pain due to trauma: Secondary | ICD-10-CM | POA: Diagnosis not present

## 2018-08-08 DIAGNOSIS — F4312 Post-traumatic stress disorder, chronic: Secondary | ICD-10-CM | POA: Diagnosis not present

## 2018-08-08 DIAGNOSIS — F603 Borderline personality disorder: Secondary | ICD-10-CM | POA: Diagnosis not present

## 2018-09-13 ENCOUNTER — Telehealth: Payer: BLUE CROSS/BLUE SHIELD | Admitting: Physician Assistant

## 2018-09-13 DIAGNOSIS — J301 Allergic rhinitis due to pollen: Secondary | ICD-10-CM | POA: Diagnosis not present

## 2018-09-13 MED ORDER — PREDNISONE 20 MG PO TABS: 20.0000 mg | ORAL_TABLET | Freq: Every day | ORAL | 0 refills | Status: DC

## 2018-09-13 NOTE — Progress Notes (Signed)
E visit for Allergic Rhinitis We are sorry that you are not feeling well.  Here is how we plan to help!  Based on what you have shared with me it looks like you have Allergic Rhinitis.  Rhinitis is when a reaction occurs that causes nasal congestion, runny nose, sneezing, and itching.  Most types of rhinitis are caused by an inflammation and are associated with symptoms in the eyes ears or throat. There are several types of rhinitis.  The most common are acute rhinitis, which is usually caused by a viral illness, allergic or seasonal rhinitis, and nonallergic or year-round rhinitis.  Nasal allergies occur certain times of the year.  Allergic rhinitis is caused when allergens in the air trigger the release of histamine in the body.  Histamine causes itching, swelling, and fluid to build up in the fragile linings of the nasal passages, sinuses and eyelids.  An itchy nose and clear discharge are common.  I recommend the following over the counter treatments: Xyzal 5 mg take 1 tablet daily  I also would recommend a nasal spray: Nasonex 2 sprays into each nostril once daily  You may also benefit from eye drops such as: Systane 1-2 driops each eye twice daily as needed  I am prescribing a short course of steroids to help you get control of your symptoms quickly. Please follow the above measures in addition to taking the prednisone.   HOME CARE:  You can use an over-the-counter saline nasal spray as needed Avoid areas where there is heavy dust, mites, or molds Stay indoors on windy days during the pollen season Keep windows closed in home, at least in bedroom; use air conditioner. Use high-efficiency house air filter Keep windows closed in car, turn AC on re-circulate Avoid playing out with dog during pollen season  GET HELP RIGHT AWAY IF:  If your symptoms do not improve within 10 days You become short of breath You develop yellow or green discharge from your nose for over 3 days You have  coughing fits  MAKE SURE YOU:  Understand these instructions Will watch your condition Will get help right away if you are not doing well or get worse  Thank you for choosing an e-visit. Your e-visit answers were reviewed by a board certified advanced clinical practitioner to complete your personal care plan. Depending upon the condition, your plan could have included both over the counter or prescription medications. Please review your pharmacy choice. Be sure that the pharmacy you have chosen is open so that you can pick up your prescription now.  If there is a problem you may message your provider in [place] to have the prescription routed to another pharmacy. Your safety is important to [place]. If you have drug allergies check your prescription carefully.  For the next 24 hours, you can use MyChart to ask questions about today's visit, request a non-urgent call back, or ask for a work or school excuse from your e-visit provider. You will get an email in the next two days asking about your experience. I hope that your e-visit has been valuable and will speed your recovery.         ===View-only below this line===   ----- Message -----    From: Riley Nearing    Sent: 09/13/2018 10:14 AM EDT      To: E-Visit Mailing List Subject: E-Visit Submission: Allergies  E-Visit Submission: Allergies --------------------------------  Question: What symptoms are you having? (Check all that apply) Answer:  Runny nose            Stuffy nose            Sneezing            Itching of the eyes, nose, and roof of mouth            Watery eyes            Ear fullness  Question: Other: Answer:     Question: Do you have a fever or chills? Answer:   No  Question: Do you have a cough? Answer:   No  Question: Are you short of breath or wheezing? Answer:   Both short of breath and wheezing  Question: Have you ever been diagnosed with asthma? Answer:   Yes  Question: When did your  symptoms start? Answer:   1-5 days ago  Question: Have you tried any over the counter medications? Answer:   Nasal spray such as Nasonex or Nasalcort            Neti pot and/or saline spray            Claritin allergy            Zyrtec allergy  Question: Over the counter medications "other": Answer:   Allegra  Question: Have any of the over the counter medications been effective? Answer:   No  Question: Do you use a humidifier? Answer:   No  Question: Are you or do you think you might be pregnant? Answer:   No  Question: Are you breastfeeding? Answer:   No  Question: Please list your medication allergies that you may have ? (If 'none' , please list as 'none') Answer:   Sulfa antibiotics  Question: Please list any additional comments  Answer:     A total of 5-10 minutes was spent evaluating this patients questionnaire and formulating a plan of care.

## 2018-09-14 DIAGNOSIS — J309 Allergic rhinitis, unspecified: Secondary | ICD-10-CM | POA: Diagnosis not present

## 2018-09-19 ENCOUNTER — Telehealth: Payer: BC Managed Care – PPO | Admitting: Family

## 2018-09-19 DIAGNOSIS — H60331 Swimmer's ear, right ear: Secondary | ICD-10-CM | POA: Diagnosis not present

## 2018-09-19 MED ORDER — NEOMYCIN-POLYMYXIN-HC 3.5-10000-1 OT SOLN: 4.0000 [drp] | Freq: Four times a day (QID) | OTIC | 0 refills | Status: DC

## 2018-09-19 NOTE — Progress Notes (Signed)
E Visit for Swimmer's Ear  We are sorry that you are not feeling well. Here is how we plan to help!  I have prescribed: Neomycin 0.35%, polymyxin B 10,000 units/mL, and hydrocortisone 0,5% otic solution 4 drops in affected ears four times a day for 7 days  In certain cases swimmer's ear may progress to a more serious bacterial infection of the middle or inner ear.  If you have a fever 102 and up and significantly worsening symptoms, this could indicate a more serious infection moving to the middle/inner and needs face to face evaluation in an office by a provider.  Your symptoms should improve over the next 3 days and should resolve in about 7 days.  HOME CARE:   Wash your hands frequently.  Do not place the tip of the bottle on your ear or touch it with your fingers.  You can take Acetominophen 650 mg every 4-6 hours as needed for pain.  If pain is severe or moderate, you can apply a heating pad (set on low) or hot water bottle (wrapped in a towel) to outer ear for 20 minutes.  This will also increase drainage.  Avoid ear plugs  Do not use Q-tips  After showers, help the water run out by tilting your head to one side.  GET HELP RIGHT AWAY IF:   Fever is over 102.2 degrees.  You develop progressive ear pain or hearing loss.  Ear symptoms persist longer than 3 days after treatment.  MAKE SURE YOU:   Understand these instructions.  Will watch your condition.  Will get help right away if you are not doing well or get worse.  TO PREVENT SWIMMER'S EAR:  Use a bathing cap or custom fitted swim molds to keep your ears dry.  Towel off after swimming to dry your ears.  Tilt your head or pull your earlobes to allow the water to escape your ear canal.  If there is still water in your ears, consider using a hairdryer on the lowest setting.  Thank you for choosing an e-visit. Your e-visit answers were reviewed by a board certified advanced clinical practitioner to complete  your personal care plan. Depending upon the condition, your plan could have included both over the counter or prescription medications. Please review your pharmacy choice. Be sure that the pharmacy you have chosen is open so that you can pick up your prescription now.  If there is a problem you may message your provider in MyChart to have the prescription routed to another pharmacy. Your safety is important to us. If you have drug allergies check your prescription carefully.  For the next 24 hours, you can use MyChart to ask questions about today's visit, request a non-urgent call back, or ask for a work or school excuse from your e-visit provider. You will get an email in the next two days asking about your experience. I hope that your e-visit has been valuable and will speed your recovery.       

## 2018-09-23 DIAGNOSIS — F4312 Post-traumatic stress disorder, chronic: Secondary | ICD-10-CM | POA: Diagnosis not present

## 2018-09-23 DIAGNOSIS — F603 Borderline personality disorder: Secondary | ICD-10-CM | POA: Diagnosis not present

## 2018-09-25 ENCOUNTER — Telehealth: Payer: BLUE CROSS/BLUE SHIELD | Admitting: Family

## 2018-09-25 DIAGNOSIS — H60501 Unspecified acute noninfective otitis externa, right ear: Secondary | ICD-10-CM

## 2018-09-25 MED ORDER — AMOXICILLIN-POT CLAVULANATE 875-125 MG PO TABS: 1.0000 | ORAL_TABLET | Freq: Two times a day (BID) | ORAL | 0 refills | Status: DC

## 2018-09-25 NOTE — Progress Notes (Signed)
E Visit for Swimmer's Ear  We are sorry that you are not feeling well. Here is how we plan to help!  Based on what you have shared with me it looks like you have swimmers ear. Swimmer's ear is a redness or swelling, irritation, or infection of your outer ear canal.  These symptoms usually occur within a few days of swimming.  Your ear canal is a tube that goes from the opening of the ear to the eardrum.  When water stays in your ear canal, germs can grow.  This is a painful condition that often happens to children and swimmers of all ages.  It is not contagious and oral antibiotics are not required to treat uncomplicated swimmer's ear.  The usual symptoms include: Itching inside the ear, Redness or a sense of swelling in the ear, Pain when the ear is tugged on when pressure is placed on the ear, Pus draining from the infected ear. Continue your ear drops and start the oral antibiotic .  Based on what you have told me you may have a bacterial infection. In addition to the ear drops I have prescribed an oral antibiotic: and Augmentin 825mg  one tablet by mouth twice a day for 10 days  In certain cases swimmer's ear may progress to a more serious bacterial infection of the middle or inner ear.  If you have a fever 102 and up and significantly worsening symptoms, this could indicate a more serious infection moving to the middle/inner and needs face to face evaluation in an office by a provider.  Your symptoms should improve over the next 3 days and should resolve in about 7 days.  Approximately 5 minutes was spent documenting and reviewing patient's chart.    HOME CARE:   Wash your hands frequently.  Do not place the tip of the bottle on your ear or touch it with your fingers.  You can take Acetominophen 650 mg every 4-6 hours as needed for pain.  If pain is severe or moderate, you can apply a heating pad (set on low) or hot water bottle (wrapped in a towel) to outer ear for 20 minutes.  This will  also increase drainage.  Avoid ear plugs  Do not use Q-tips  After showers, help the water run out by tilting your head to one side.  GET HELP RIGHT AWAY IF:   Fever is over 102.2 degrees.  You develop progressive ear pain or hearing loss.  Ear symptoms persist longer than 3 days after treatment.  MAKE SURE YOU:   Understand these instructions.  Will watch your condition.  Will get help right away if you are not doing well or get worse.  TO PREVENT SWIMMER'S EAR:  Use a bathing cap or custom fitted swim molds to keep your ears dry.  Towel off after swimming to dry your ears.  Tilt your head or pull your earlobes to allow the water to escape your ear canal.  If there is still water in your ears, consider using a hairdryer on the lowest setting.  Thank you for choosing an e-visit. Your e-visit answers were reviewed by a board certified advanced clinical practitioner to complete your personal care plan. Depending upon the condition, your plan could have included both over the counter or prescription medications. Please review your pharmacy choice. Be sure that the pharmacy you have chosen is open so that you can pick up your prescription now.  If there is a problem you may message your provider  in [place] to have the prescription routed to another pharmacy. Your safety is important to [place]. If you have drug allergies check your prescription carefully.  For the next 24 hours, you can use MyChart to ask questions about today's visit, request a non-urgent call back, or ask for a work or school excuse from your e-visit provider. You will get an email in the next two days asking about your experience. I hope that your e-visit has been valuable and will speed your recovery.

## 2018-09-28 ENCOUNTER — Ambulatory Visit (INDEPENDENT_AMBULATORY_CARE_PROVIDER_SITE_OTHER): Payer: BLUE CROSS/BLUE SHIELD | Admitting: Internal Medicine

## 2018-09-28 ENCOUNTER — Encounter: Payer: Self-pay | Admitting: Internal Medicine

## 2018-09-28 DIAGNOSIS — H9202 Otalgia, left ear: Secondary | ICD-10-CM | POA: Diagnosis not present

## 2018-09-28 NOTE — Progress Notes (Signed)
Virtual Visit via Video Note  I connected with Dawn Porter on 09/28/18 at 11:00 AM EDT by a video enabled telemedicine application and verified that I am speaking with the correct person using two identifiers.   I discussed the limitations of evaluation and management by telemedicine and the availability of in person appointments. The patient expressed understanding and agreed to proceed.  History of Present Illness: The patient is a 28 y.o. female with visit for left ear pain. Started 2-3 weeks ago. Has pain in the ear and yellow drainage. Denies fevers or chills. Started upon return to [employer] with systemic allergy symptoms. Had sinus pressure, headaches, ear pain right and left. Did e-visit with given cortisporin drops which helped the right ear but left not improved. Did another e-visit and got augmentin which she started 2 days ago. Did start taking this but has not noticed any difference yet. Denies side effects. Denies fevers or chills or cough or SOB. Overall it is improving mildly but left ear still hurting. Has tried cortisporin and otc allergy medicines like allegra and nose spray.  Observations/Objective: Appearance: normal, breathing appears normal, casual grooming, abdomen does not appear distended, throat normal, mental status is A and O times 3  Assessment and Plan: See problem oriented charting  Follow Up Instructions: continue augmentin and call back in 2-3 days if no improvement  I discussed the assessment and treatment plan with the patient. The patient was provided an opportunity to ask questions and all were answered. The patient agreed with the plan and demonstrated an understanding of the instructions.   The patient was advised to call back or seek an in-person evaluation if the symptoms worsen or if the condition fails to improve as anticipated.  Hoyt Koch, MD

## 2018-09-28 NOTE — Assessment & Plan Note (Signed)
She has just started augmentin and needs to give this time to work. Continue allergy medicine. Call back in 2-3 days if no improvement.

## 2018-09-29 DIAGNOSIS — F603 Borderline personality disorder: Secondary | ICD-10-CM | POA: Diagnosis not present

## 2018-09-29 DIAGNOSIS — F4312 Post-traumatic stress disorder, chronic: Secondary | ICD-10-CM | POA: Diagnosis not present

## 2018-09-30 ENCOUNTER — Encounter: Payer: Self-pay | Admitting: Internal Medicine

## 2018-09-30 MED ORDER — AZITHROMYCIN 250 MG PO TABS: ORAL_TABLET | ORAL | 0 refills | Status: DC

## 2018-10-05 DIAGNOSIS — F603 Borderline personality disorder: Secondary | ICD-10-CM | POA: Diagnosis not present

## 2018-10-06 DIAGNOSIS — F603 Borderline personality disorder: Secondary | ICD-10-CM | POA: Diagnosis not present

## 2018-10-06 DIAGNOSIS — F4312 Post-traumatic stress disorder, chronic: Secondary | ICD-10-CM | POA: Diagnosis not present

## 2018-10-13 DIAGNOSIS — F603 Borderline personality disorder: Secondary | ICD-10-CM | POA: Diagnosis not present

## 2018-10-13 DIAGNOSIS — F4312 Post-traumatic stress disorder, chronic: Secondary | ICD-10-CM | POA: Diagnosis not present

## 2018-10-14 DIAGNOSIS — F4312 Post-traumatic stress disorder, chronic: Secondary | ICD-10-CM | POA: Diagnosis not present

## 2018-10-14 DIAGNOSIS — F603 Borderline personality disorder: Secondary | ICD-10-CM | POA: Diagnosis not present

## 2018-10-20 DIAGNOSIS — F603 Borderline personality disorder: Secondary | ICD-10-CM | POA: Diagnosis not present

## 2018-10-20 DIAGNOSIS — F4312 Post-traumatic stress disorder, chronic: Secondary | ICD-10-CM | POA: Diagnosis not present

## 2018-10-27 DIAGNOSIS — F603 Borderline personality disorder: Secondary | ICD-10-CM | POA: Diagnosis not present

## 2018-10-27 DIAGNOSIS — F4312 Post-traumatic stress disorder, chronic: Secondary | ICD-10-CM | POA: Diagnosis not present

## 2018-10-30 ENCOUNTER — Telehealth: Payer: BLUE CROSS/BLUE SHIELD | Admitting: Family

## 2018-10-30 DIAGNOSIS — R110 Nausea: Secondary | ICD-10-CM

## 2018-10-30 DIAGNOSIS — M545 Low back pain, unspecified: Secondary | ICD-10-CM

## 2018-10-30 NOTE — Progress Notes (Signed)
Based on what you shared with me, I feel your condition warrants further evaluation and I recommend that you be seen for a face to face office visit.  Given your given your symptoms of lower back and nausea you need to be seen face-to-face to be evaluated. This could be a UTI or some other type of infection.   NOTE: If you entered your credit card information for this eVisit, you will not be charged. You may see a "hold" on your card for the $35 but that hold will drop off and you will not have a charge processed.  If you are having a true medical emergency please call 911.  If you need an urgent face to face visit, [facility] has four urgent care centers for your convenience.    PLEASE NOTE: THE [facility] LOCATIONS AND URGENT CARE CLINICS DO NOT HAVE THE TESTING FOR CORONAVIRUS COVID19 AVAILABLE.  IF YOU FEEL YOU NEED THIS TEST YOU MUST GO TO A TRIAGE LOCATION AT [facility]   [email or web address] to reserve your spot online an avoid wait times  [facility] [address] [place], [place] [address] Modified hours of operation: Monday-Friday, 12 PM to 6 PM  Saturday & Sunday 10 AM to 4 PM *Across the street from [facility] (New Address!) [address], [place], [place] [address] *Just off [address], across the road from [place] hours of operation: Monday-Friday, 12 PM to 6 PM  Closed Saturday & Sunday  [facility]'s modified hours of operation will be in effect from May 1 until May 31   The following sites will take your insurance:  . [facility] [place] a Provider at this Location  [address] [place], [place] [address] . 10 am to 8 pm Monday-Friday . 12 pm to 8 pm Saturday-Sunday   . [facility] [place] a Provider at this  Location  [address], [place], [place] [address] . 8 am to 8 pm Monday-Friday . 9 am to 6 pm Saturday . 11 am to 6 pm Sunday   . [facility] at [place] Get Driving Directions  [address] [place], [place] [address] . 8 am to 8 pm Monday-Friday . 8 am to 4 pm Saturday-Sunday   Your e-visit answers were reviewed by a board certified advanced clinical practitioner to complete your personal care plan.  Thank you for using e-Visits.

## 2018-11-01 ENCOUNTER — Encounter: Payer: Self-pay | Admitting: Internal Medicine

## 2018-11-01 ENCOUNTER — Ambulatory Visit (INDEPENDENT_AMBULATORY_CARE_PROVIDER_SITE_OTHER): Payer: BLUE CROSS/BLUE SHIELD | Admitting: Internal Medicine

## 2018-11-01 ENCOUNTER — Other Ambulatory Visit (INDEPENDENT_AMBULATORY_CARE_PROVIDER_SITE_OTHER): Payer: BLUE CROSS/BLUE SHIELD

## 2018-11-01 DIAGNOSIS — R110 Nausea: Secondary | ICD-10-CM

## 2018-11-01 DIAGNOSIS — T753XXA Motion sickness, initial encounter: Secondary | ICD-10-CM | POA: Insufficient documentation

## 2018-11-01 LAB — COMPREHENSIVE METABOLIC PANEL
ALT: 12 U/L (ref 0–35)
AST: 13 U/L (ref 0–37)
Albumin: 4.4 g/dL (ref 3.5–5.2)
Alkaline Phosphatase: 74 U/L (ref 39–117)
BUN: 22 mg/dL (ref 6–23)
CO2: 22 mEq/L (ref 19–32)
Calcium: 9.5 mg/dL (ref 8.4–10.5)
Chloride: 103 mEq/L (ref 96–112)
Creatinine, Ser: 0.74 mg/dL (ref 0.40–1.20)
GFR: 92.08 mL/min (ref >60.00)
Glucose, Bld: 100 mg/dL — ABNORMAL HIGH (ref 70–99)
Potassium: 4.1 mEq/L (ref 3.5–5.1)
Sodium: 134 mEq/L — ABNORMAL LOW (ref 135–145)
Total Bilirubin: 0.4 mg/dL (ref 0.2–1.2)
Total Protein: 7.6 g/dL (ref 6.0–8.3)

## 2018-11-01 LAB — URINALYSIS, ROUTINE W REFLEX MICROSCOPIC
Bilirubin Urine: NEGATIVE
Ketones, ur: NEGATIVE
Leukocytes,Ua: NEGATIVE
Nitrite: NEGATIVE
Specific Gravity, Urine: 1.02 (ref 1.000–1.030)
Total Protein, Urine: NEGATIVE
Urine Glucose: NEGATIVE
Urobilinogen, UA: 0.2 (ref 0.0–1.0)
pH: 6 (ref 5.0–8.0)

## 2018-11-01 LAB — CBC
HCT: 40.8 % (ref 36.0–46.0)
Hemoglobin: 13.9 g/dL (ref 12.0–15.0)
MCHC: 34.2 g/dL (ref 30.0–36.0)
MCV: 84.7 fl (ref 78.0–100.0)
Platelets: 364 10*3/uL (ref 150.0–400.0)
RBC: 4.82 Mil/uL (ref 3.87–5.11)
RDW: 13.5 % (ref 11.5–15.5)
WBC: 8.6 10*3/uL (ref 4.0–10.5)

## 2018-11-01 LAB — LIPASE: Lipase: 34 U/L (ref 11.0–59.0)

## 2018-11-01 MED ORDER — ONDANSETRON HCL 4 MG PO TABS: 4.0000 mg | ORAL_TABLET | Freq: Three times a day (TID) | ORAL | 0 refills | Status: DC | PRN

## 2018-11-01 NOTE — Assessment & Plan Note (Signed)
Checking cbc, cmp, lipase, U/A and urine pregnancy. Rx for zofran and adjust plan as needed.

## 2018-11-01 NOTE — Progress Notes (Signed)
Virtual Visit via Video Note  I connected with Dawn Porter on 11/01/18 at  1:40 PM EDT by a video enabled telemedicine application and verified that I am speaking with the correct person using two identifiers.  The patient and the provider were at separate locations throughout the entire encounter.   I discussed the limitations of evaluation and management by telemedicine and the availability of in person appointments. The patient expressed understanding and agreed to proceed.  History of Present Illness: The patient is a 28 y.o. female with visit for mid back pain and nausea. Started about 2-3 days ago. She has not thrown up but nauseous and several times dizzy with this. Is on adderall now and has poor appetite. This is even worse in the last few days. Pain in the middle back is painful and she is taking all of her medications without much relief. Taking naproxen and tylenol helps some. Has no blood in stool, diarrhea, constipation. Denies dysuria or blood in urine or frequency or urgency. Denies chance of pregnancy, still has IUD. Overall it is stable but not improving. Has tried skelaxin, naproxen, tylenol. Denies fever but has not checked. Has chills with this.  Observations/Objective: Appearance: normal, breathing appears normal, casual grooming, abdomen does not appear distended, middle back with tenderness to palpation, throat normal, memory normal, mental status is A and O times 3  Assessment and Plan: See problem oriented charting  Follow Up Instructions: Checking lipase, CBC CMP, rx for zofran  I discussed the assessment and treatment plan with the patient. The patient was provided an opportunity to ask questions and all were answered. The patient agreed with the plan and demonstrated an understanding of the instructions.   The patient was advised to call back or seek an in-person evaluation if the symptoms worsen or if the condition fails to improve as anticipated.  Hoyt Koch, MD

## 2018-11-02 LAB — PREGNANCY, URINE: Preg Test, Ur: NEGATIVE

## 2018-11-03 DIAGNOSIS — F603 Borderline personality disorder: Secondary | ICD-10-CM | POA: Diagnosis not present

## 2018-11-03 DIAGNOSIS — F4312 Post-traumatic stress disorder, chronic: Secondary | ICD-10-CM | POA: Diagnosis not present

## 2018-11-10 DIAGNOSIS — F4312 Post-traumatic stress disorder, chronic: Secondary | ICD-10-CM | POA: Diagnosis not present

## 2018-11-10 DIAGNOSIS — F603 Borderline personality disorder: Secondary | ICD-10-CM | POA: Diagnosis not present

## 2018-11-17 DIAGNOSIS — F603 Borderline personality disorder: Secondary | ICD-10-CM | POA: Diagnosis not present

## 2018-11-17 DIAGNOSIS — F4312 Post-traumatic stress disorder, chronic: Secondary | ICD-10-CM | POA: Diagnosis not present

## 2018-11-18 DIAGNOSIS — F603 Borderline personality disorder: Secondary | ICD-10-CM | POA: Diagnosis not present

## 2018-11-29 DIAGNOSIS — F603 Borderline personality disorder: Secondary | ICD-10-CM | POA: Diagnosis not present

## 2018-11-29 DIAGNOSIS — F4312 Post-traumatic stress disorder, chronic: Secondary | ICD-10-CM | POA: Diagnosis not present

## 2018-12-12 DIAGNOSIS — F603 Borderline personality disorder: Secondary | ICD-10-CM | POA: Diagnosis not present

## 2018-12-24 DIAGNOSIS — F4312 Post-traumatic stress disorder, chronic: Secondary | ICD-10-CM | POA: Diagnosis not present

## 2018-12-24 DIAGNOSIS — F603 Borderline personality disorder: Secondary | ICD-10-CM | POA: Diagnosis not present

## 2019-01-03 ENCOUNTER — Telehealth: Payer: BLUE CROSS/BLUE SHIELD | Admitting: Physician Assistant

## 2019-01-03 ENCOUNTER — Encounter: Payer: Self-pay | Admitting: Physician Assistant

## 2019-01-03 DIAGNOSIS — B3731 Acute candidiasis of vulva and vagina: Secondary | ICD-10-CM

## 2019-01-03 DIAGNOSIS — M5489 Other dorsalgia: Secondary | ICD-10-CM | POA: Diagnosis not present

## 2019-01-03 DIAGNOSIS — B373 Candidiasis of vulva and vagina: Secondary | ICD-10-CM

## 2019-01-03 MED ORDER — FLUCONAZOLE 150 MG PO TABS: 150.0000 mg | ORAL_TABLET | Freq: Once | ORAL | 0 refills | Status: AC

## 2019-01-03 NOTE — Progress Notes (Signed)
We are sorry that you are not feeling well. Here is how we plan to help! Based on what you shared with me it looks like you: May have a yeast vaginosis   You have indicated that your back pain is typical of menstrual cramps. Any changes to your back pain, follow up with your doctor.   Vaginosis is an inflammation of the vagina that can result in discharge, itching and pain. The cause is usually a change in the normal balance of vaginal bacteria or an infection. Vaginosis can also result from reduced estrogen levels after menopause.  The most common causes of vaginosis are:   Bacterial vaginosis which results from an overgrowth of one on several organisms that are normally present in your vagina.   Yeast infections which are caused by a naturally occurring fungus called candida.   Vaginal atrophy (atrophic vaginosis) which results from the thinning of the vagina from reduced estrogen levels after menopause.   Trichomoniasis which is caused by a parasite and is commonly transmitted by sexual intercourse.  Factors that increase your risk of developing vaginosis include: Marland Kitchen Medications, such as antibiotics and steroids . Uncontrolled diabetes . Use of hygiene products such as bubble bath, vaginal spray or vaginal deodorant . Douching . Wearing damp or tight-fitting clothing . Using an intrauterine device (IUD) for birth control . Hormonal changes, such as those associated with pregnancy, birth control pills or menopause . Sexual activity . Having a sexually transmitted infection  Your treatment plan is A single Diflucan (fluconazole) 150mg  tablet once.  I have electronically sent this prescription into the pharmacy that you have chosen.  Be sure to take all of the medication as directed. Stop taking any medication if you develop a rash, tongue swelling or shortness of breath. Mothers who are breast feeding should consider pumping and discarding their breast milk while on these antibiotics.  However, there is no consensus that infant exposure at these doses would be harmful.  Remember that medication creams can weaken latex condoms. Marland Kitchen   HOME CARE:  Good hygiene may prevent some types of vaginosis from recurring and may relieve some symptoms:  . Avoid baths, hot tubs and whirlpool spas. Rinse soap from your outer genital area after a shower, and dry the area well to prevent irritation. Don't use scented or harsh soaps, such as those with deodorant or antibacterial action. Marland Kitchen Avoid irritants. These include scented tampons and pads. . Wipe from front to back after using the toilet. Doing so avoids spreading fecal bacteria to your vagina.  Other things that may help prevent vaginosis include:  Marland Kitchen Don't douche. Your vagina doesn't require cleansing other than normal bathing. Repetitive douching disrupts the normal organisms that reside in the vagina and can actually increase your risk of vaginal infection. Douching won't clear up a vaginal infection. . Use a latex condom. Both female and female latex condoms may help you avoid infections spread by sexual contact. . Wear cotton underwear. Also wear pantyhose with a cotton crotch. If you feel comfortable without it, skip wearing underwear to bed. Yeast thrives in [employer] Your symptoms should improve in the next day or two.  GET HELP RIGHT AWAY IF:  . You have pain in your lower abdomen ( pelvic area or over your ovaries) . You develop nausea or vomiting . You develop a fever . Your discharge changes or worsens . You have persistent pain with intercourse . You develop shortness of breath, a rapid pulse, or you faint.  These symptoms could be signs of problems or infections that need to be evaluated by a medical provider now.  MAKE SURE YOU    Understand these instructions.  Will watch your condition.  Will get help right away if you are not doing well or get worse.  Your e-visit answers were reviewed by a board  certified advanced clinical practitioner to complete your personal care plan. Depending upon the condition, your plan could have included both over the counter or prescription medications. Please review your pharmacy choice to make sure that you have choses a pharmacy that is open for you to pick up any needed prescription, Your safety is important to [place]. If you have drug allergies check your prescription carefully.   You can use MyChart to ask questions about today's visit, request a non-urgent call back, or ask for a work or school excuse for 24 hours related to this e-Visit. If it has been greater than 24 hours you will need to follow up with your provider, or enter a new e-Visit to address those concerns. You will get a MyChart message within the next two days asking about your experience. I hope that your e-visit has been valuable and will speed your recovery. I spent 5-10 minutes on review and completion of this note- Lacy Duverney [facility]

## 2019-01-07 DIAGNOSIS — F603 Borderline personality disorder: Secondary | ICD-10-CM | POA: Diagnosis not present

## 2019-01-07 DIAGNOSIS — F4312 Post-traumatic stress disorder, chronic: Secondary | ICD-10-CM | POA: Diagnosis not present

## 2019-01-11 DIAGNOSIS — F603 Borderline personality disorder: Secondary | ICD-10-CM | POA: Diagnosis not present

## 2019-01-24 ENCOUNTER — Other Ambulatory Visit: Payer: Self-pay

## 2019-01-24 ENCOUNTER — Ambulatory Visit: Payer: BC Managed Care – PPO | Admitting: Women's Health

## 2019-01-24 ENCOUNTER — Encounter: Payer: Self-pay | Admitting: Women's Health

## 2019-01-24 VITALS — BP 130/82

## 2019-01-24 DIAGNOSIS — N898 Other specified noninflammatory disorders of vagina: Secondary | ICD-10-CM
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
## 2019-01-25 LAB — TIQ-NTM

## 2019-01-25 LAB — WET PREP FOR TRICH, YEAST, CLUE

## 2019-01-28 ENCOUNTER — Encounter: Payer: Self-pay | Admitting: Women's Health

## 2019-01-28 LAB — HERPES SIMPLEX VIRUS CULTURE

## 2019-01-28 LAB — TEST AUTHORIZATION

## 2019-01-28 LAB — SURESWAB HSV, TYPE 1/2 DNA, PCR
HSV 1 DNA: NOT DETECTED
HSV 2 DNA: NOT DETECTED

## 2019-02-08 ENCOUNTER — Other Ambulatory Visit: Payer: Self-pay

## 2019-02-08 ENCOUNTER — Encounter: Payer: Self-pay | Admitting: Obstetrics & Gynecology

## 2019-02-08 ENCOUNTER — Ambulatory Visit (INDEPENDENT_AMBULATORY_CARE_PROVIDER_SITE_OTHER): Payer: BC Managed Care – PPO | Admitting: Obstetrics & Gynecology

## 2019-02-08 VITALS — BP 126/84

## 2019-02-08 DIAGNOSIS — F603 Borderline personality disorder: Secondary | ICD-10-CM | POA: Diagnosis not present

## 2019-02-08 DIAGNOSIS — N9412 Deep dyspareunia: Secondary | ICD-10-CM

## 2019-02-08 DIAGNOSIS — Z30431 Encounter for routine checking of intrauterine contraceptive device: Secondary | ICD-10-CM

## 2019-02-08 LAB — WET PREP FOR TRICH, YEAST, CLUE

## 2019-02-08 NOTE — Progress Notes (Signed)
    Dawn Porter 03-11-1991 [id number]        28 y.o.  G0P0000 Single.  New boyfriend x 2 months  RP: Deep dyspareunia x 2 months  HPI: Well on Skyla since October 2019.  No breakthrough bleeding.  Deep dyspareunia with each intercourse for the last 2 months which coincides with the start of sexual activity with her new boyfriend.  No pelvic pain outside sexual activity.  Normal vaginal secretions.  No fever.  No urinary tract infection symptoms and normal bowel movements.   OB History  Gravida Para Term Preterm AB Living  0 0 0 0 0 0  SAB TAB Ectopic Multiple Live Births  0 0 0 0 0    Past medical history,surgical history, problem list, medications, allergies, family history and social history were all reviewed and documented in the EPIC chart.   Directed ROS with pertinent positives and negatives documented in the history of present illness/assessment and plan.  Exam:  Vitals:   02/08/19 1614  BP: 126/84   General appearance:  Normal  Abdomen: Normal  Gynecologic exam: Vulva normal.  Speculum: Cervix normal.  IUD strings visible.  Gonorrhea and chlamydia done on the cervix.  Normal vaginal secretions.  Wet prep done.  Bimanual exam: Uterus anteverted, normal volume, mildly tender, mobile.  No adnexal mass, nontender bilaterally.  Wet prep negative.   Assessment/Plan:  28 y.o. G0  1. Deep dyspareunia Deep dyspareunia with new boyfriend.  Precautions and counseling done, changing position and depth of penetration discussed.  Will rule out gonorrhea or chlamydia infection.  Wet prep was negative.  Follow-up with pelvic ultrasound to rule out a uterine or ovarian pathology and to confirm good position of the IUD. - C. trachomatis/N. gonorrhoeae RNA - WET PREP FOR TRICH, YEAST, CLUE - US Transvaginal Non-OB; Future  2. Encounter for routine checking of intrauterine contraceptive device (IUD) IUD strings visible at the exocervix.  We will confirm good position of the IUD  by ultrasound. - US Transvaginal Non-OB; Future  Other orders - Brexpiprazole (REXULTI) 0.25 MG TABS; Take by mouth.  Counseling on above issues and coordination of care more than 50% for 25 minutes.  [name] MD, 4:39 PM 02/08/2019

## 2019-02-09 LAB — C. TRACHOMATIS/N. GONORRHOEAE RNA
C. trachomatis RNA, TMA: NOT DETECTED
N. gonorrhoeae RNA, TMA: NOT DETECTED

## 2019-02-10 ENCOUNTER — Other Ambulatory Visit: Payer: Self-pay

## 2019-02-11 ENCOUNTER — Encounter: Payer: Self-pay | Admitting: Obstetrics & Gynecology

## 2019-02-11 NOTE — Patient Instructions (Signed)
1. Deep dyspareunia Depth of penetration discussed.  Will rule out gonorrhea or chlamydia infection.  Wet prep was negative.  Follow-up with pelvic ultrasound to rule out a uterine or ovarian pathology and to confirm good position of the IUD. - C. trachomatis/N. gonorrhoeae RNA - WET PREP FOR TRICH, YEAST, CLUE - US Transvaginal Non-OB; Future  2. Encounter for routine checking of intrauterine contraceptive device (IUD) IUD strings visible at the exocervix.  We will confirm good position of the IUD by ultrasound. - US Transvaginal Non-OB; Future  Other orders - Brexpiprazole (REXULTI) 0.25 MG TABS; Take by mouth.  Jess, it was a pleasure seeing you today!  I will inform you of your results as soon as they are available.

## 2019-02-13 ENCOUNTER — Other Ambulatory Visit: Payer: Self-pay

## 2019-02-13 ENCOUNTER — Ambulatory Visit (INDEPENDENT_AMBULATORY_CARE_PROVIDER_SITE_OTHER): Payer: BC Managed Care – PPO

## 2019-02-13 ENCOUNTER — Ambulatory Visit (INDEPENDENT_AMBULATORY_CARE_PROVIDER_SITE_OTHER): Payer: BC Managed Care – PPO | Admitting: Obstetrics & Gynecology

## 2019-02-13 ENCOUNTER — Encounter: Payer: Self-pay | Admitting: Obstetrics & Gynecology

## 2019-02-13 DIAGNOSIS — Z30431 Encounter for routine checking of intrauterine contraceptive device: Secondary | ICD-10-CM | POA: Diagnosis not present

## 2019-02-13 DIAGNOSIS — N9412 Deep dyspareunia: Secondary | ICD-10-CM

## 2019-02-13 NOTE — Progress Notes (Signed)
    Dawn Porter 30-Feb-1992 [id number]        28 y.o.  G0 Single.  New boyfriend x 2 months  RP: Deep dyspareunia for Pelvic US  HPI: Deep dyspareunia with new boyfriend x2 months.  Well on Skyla IUD otherwise with normal breakthrough bleeding and no pelvic pain.  Gonorrhea and chlamydia as well as wet prep were negative last visit August 25.   OB History  Gravida Para Term Preterm AB Living  0 0 0 0 0 0  SAB TAB Ectopic Multiple Live Births  0 0 0 0 0    Past medical history,surgical history, problem list, medications, allergies, family history and social history were all reviewed and documented in the EPIC chart.   Directed ROS with pertinent positives and negatives documented in the history of present illness/assessment and plan.  Exam:  There were no vitals filed for this visit. General appearance:  Normal  Pelvic US today: T/V images.  Uterus anteverted homogeneous measuring 7.49 x 4.04 x 2.48 cm.  Endometrial lining normal and thin at 2.3 mm.  IUD seen in normal position in the intrauterine cavity.  Right and left ovaries normal.  No apparent mass in the right or left adnexa.  No free fluid in the posterior cul-de-sac.   Gono-Chlam 02/08/19:  Negative Wet prep 02/08/19:  Negative   Assessment/Plan:  28 y.o. G0   1. Deep dyspareunia Normal gynecologic exam at last visit.  Pelvic ultrasound completely normal with a normal size uterus, thin endometrium and the Skyla IUD in normal intrauterine position.  Both ovaries are completely normal with no adnexal mass and no free fluid in the posterior cul-de-sac.  Gonorrhea and Chlamydia were negative, wet prep was negative last visit.  Patient was reassured.  Counseling on intercourse with changes in position and depth of penetration discussed.  Counseling on above issues and coordination of care more than 50% for 15 minutes.  [name] MD, 4:09 PM 02/13/2019

## 2019-02-13 NOTE — Patient Instructions (Signed)
1. Deep dyspareunia Normal gynecologic exam at last visit.  Pelvic ultrasound completely normal with a normal size uterus, thin endometrium and the Skyla IUD in normal intrauterine position.  Both ovaries are completely normal with no adnexal mass and no free fluid in the posterior cul-de-sac.  Gonorrhea and Chlamydia were negative, wet prep was negative last visit.  Patient was reassured.  Counseling on intercourse with changes in position and depth of penetration discussed.  Dawn Porter, it was a pleasure seeing you today!

## 2019-03-06 ENCOUNTER — Encounter: Payer: Self-pay | Admitting: Gynecology

## 2019-03-16 ENCOUNTER — Other Ambulatory Visit: Payer: BC Managed Care – PPO

## 2019-03-16 ENCOUNTER — Ambulatory Visit: Payer: BC Managed Care – PPO | Admitting: Obstetrics & Gynecology

## 2019-03-16 DIAGNOSIS — F603 Borderline personality disorder: Secondary | ICD-10-CM | POA: Diagnosis not present

## 2019-03-23 DIAGNOSIS — H6123 Impacted cerumen, bilateral: Secondary | ICD-10-CM | POA: Diagnosis not present

## 2019-03-30 ENCOUNTER — Telehealth: Payer: BC Managed Care – PPO | Admitting: Physician Assistant

## 2019-03-30 DIAGNOSIS — M545 Low back pain, unspecified: Secondary | ICD-10-CM

## 2019-03-30 DIAGNOSIS — M5410 Radiculopathy, site unspecified: Secondary | ICD-10-CM

## 2019-03-30 DIAGNOSIS — R29898 Other symptoms and signs involving the musculoskeletal system: Secondary | ICD-10-CM

## 2019-03-30 NOTE — Progress Notes (Signed)
Based on what you shared with me, I feel your condition warrants further evaluation and I recommend that you be seen for a face to face office visit.  Giving chronicity of pain, severity, radiation into both lower legs with numbness/sensory changes -- you need to be seen in-person ASAP, either with your PCP, and Urgent Care, or if very severe in the ER. There is concern for nerve compression based on what you are telling me.   NOTE: If you entered your credit card information for this eVisit, you will not be charged. You may see a "hold" on your card for the $35 but that hold will drop off and you will not have a charge processed.  If you are having a true medical emergency please call 911.     For an urgent face to face visit, [place] has four urgent care centers for your convenience:   . [facility]    [phone]                  Get Driving Directions  [id number] [place], [place] [address] . 10 am to 8 pm Monday-Friday . 12 pm to 8 pm Saturday-Sunday   . [facility] at [place]                  Get Driving Directions  [id number] [place], [place], [place] [address] . 8 am to 8 pm Monday-Friday . 9 am to 6 pm Saturday . 11 am to 6 pm Sunday   . [facility] at [place]                  Get Driving Directions   [address] [place], [place] [address] . 8 am to 8 pm Monday-Friday . 8 am to 4 pm Saturday-Sunday    . [facility] at [place]                    Get Driving Directions  [id number]  [address] [place], [place] [address]  . Monday-Friday, 12 PM to 6 PM    Your e-visit answers were reviewed by a board certified advanced clinical practitioner to complete your personal care plan.  Thank you for using e-Visits.

## 2019-04-03 ENCOUNTER — Inpatient Hospital Stay: Admission: RE | Admit: 2019-04-03 | Payer: BC Managed Care – PPO | Source: Ambulatory Visit

## 2019-04-05 ENCOUNTER — Encounter: Payer: Self-pay | Admitting: Internal Medicine

## 2019-04-05 ENCOUNTER — Ambulatory Visit (INDEPENDENT_AMBULATORY_CARE_PROVIDER_SITE_OTHER): Payer: BC Managed Care – PPO | Admitting: Internal Medicine

## 2019-04-05 ENCOUNTER — Other Ambulatory Visit: Payer: Self-pay

## 2019-04-05 ENCOUNTER — Telehealth: Payer: Self-pay | Admitting: Internal Medicine

## 2019-04-05 VITALS — BP 122/74 | HR 89 | Temp 98.2°F | Ht 64.0 in | Wt 175.0 lb

## 2019-04-05 DIAGNOSIS — Z23 Encounter for immunization: Secondary | ICD-10-CM | POA: Diagnosis not present

## 2019-04-05 DIAGNOSIS — M545 Low back pain, unspecified: Secondary | ICD-10-CM | POA: Insufficient documentation

## 2019-04-05 MED ORDER — CYCLOBENZAPRINE HCL 5 MG PO TABS: 5.0000 mg | ORAL_TABLET | Freq: Two times a day (BID) | ORAL | 1 refills | Status: DC | PRN

## 2019-04-05 MED ORDER — METHYLPREDNISOLONE ACETATE 40 MG/ML IJ SUSP
40.0000 mg | Freq: Once | INTRAMUSCULAR | Status: AC
  Administered 2019-04-05: 40 mg via INTRAMUSCULAR

## 2019-04-05 MED ORDER — MELOXICAM 15 MG PO TABS: 15.0000 mg | ORAL_TABLET | Freq: Every day | ORAL | 3 refills | Status: DC

## 2019-04-05 NOTE — Telephone Encounter (Signed)
Pt would like her FLEXERIL changed, states it makes her very drowsy, please advise

## 2019-04-05 NOTE — Progress Notes (Signed)
   Subjective:   Patient ID: Dawn Porter, female    DOB: 01/26/1991, 28 y.o.   MRN: [id number]  HPI The patient is a 28 YO female coming in for back pain. Started years ago and is intermittent. Since she is working from home and school from home online she is not moving as much. Her back is hurting a lot more. She denies numbness or weakness in her legs. Has been taking tylenol and ibuprofen as often as allowed and is worried about taking too much of this. It does help some but does not last long. 8/10 maximum. Several old injuries to her back. Has done PT in the past which has helped. Does not do any exercises for her back. Denies injury recently. Whole back is hurting but lumbar region worst.   Review of Systems  Constitutional: Positive for activity change. Negative for appetite change, chills, fatigue, fever and unexpected weight change.  Respiratory: Negative.   Cardiovascular: Negative.   Gastrointestinal: Negative.   Musculoskeletal: Positive for arthralgias, back pain and myalgias. Negative for gait problem and joint swelling.  Skin: Negative.   Neurological: Negative.     Objective:  Physical Exam Constitutional:      Appearance: She is well-developed.  HENT:     Head: Normocephalic and atraumatic.  Neck:     Musculoskeletal: Normal range of motion.  Cardiovascular:     Rate and Rhythm: Normal rate and regular rhythm.  Pulmonary:     Effort: Pulmonary effort is normal. No respiratory distress.     Breath sounds: Normal breath sounds. No wheezing or rales.  Abdominal:     General: Bowel sounds are normal. There is no distension.     Palpations: Abdomen is soft.     Tenderness: There is no abdominal tenderness. There is no rebound.  Musculoskeletal:        General: Tenderness present.  Skin:    General: Skin is warm and dry.  Neurological:     Mental Status: She is alert and oriented to person, place, and time.     Coordination: Coordination normal.     Vitals:    04/05/19 0927  BP: 122/74  Pulse: 89  Temp: 98.2 F (36.8 C)  TempSrc: Oral  SpO2: 99%  Weight: 175 lb (79.4 kg)  Height: 5\' 4"  (1.626 m)    Assessment & Plan:  Depo-medrol 40 mg IM given at visit

## 2019-04-05 NOTE — Telephone Encounter (Signed)
Pt states that this medication causes her to be drowsy the next day and she needs to be alert for her job. She requests any other muscle relaxer to be sent in. Please advise.

## 2019-04-05 NOTE — Patient Instructions (Addendum)
We have sent in the antiinflammatory meloxicam to take daily. You can take tylenol with this but do not take motrin, ibuprofen, aleve, naproxen with this medicine.   We have sent in the muscle relaxer flexeril to try for the back pain also.    Back Exercises These exercises help to make your trunk and back strong. They also help to keep the lower back flexible. Doing these exercises can help to prevent back pain or lessen existing pain.  If you have back pain, try to do these exercises 2-3 times each day or as told by your doctor.  As you get better, do the exercises once each day. Repeat the exercises more often as told by your doctor.  To stop back pain from coming back, do the exercises once each day, or as told by your doctor. Exercises Single knee to chest Do these steps 3-5 times in a row for each leg: 1. Lie on your back on a firm bed or the floor with your legs stretched out. 2. Bring one knee to your chest. 3. Grab your knee or thigh with both hands and hold them it in place. 4. Pull on your knee until you feel a gentle stretch in your lower back or buttocks. 5. Keep doing the stretch for 10-30 seconds. 6. Slowly let go of your leg and straighten it. Pelvic tilt Do these steps 5-10 times in a row: 1. Lie on your back on a firm bed or the floor with your legs stretched out. 2. Bend your knees so they point up to the ceiling. Your feet should be flat on the floor. 3. Tighten your lower belly (abdomen) muscles to press your lower back against the floor. This will make your tailbone point up to the ceiling instead of pointing down to your feet or the floor. 4. Stay in this position for 5-10 seconds while you gently tighten your muscles and breathe evenly. Cat-cow Do these steps until your lower back bends more easily: 1. Get on your hands and knees on a firm surface. Keep your hands under your shoulders, and keep your knees under your hips. You may put padding under your  knees. 2. Let your head hang down toward your chest. Tighten (contract) the muscles in your belly. Point your tailbone toward the floor so your lower back becomes rounded like the back of a cat. 3. Stay in this position for 5 seconds. 4. Slowly lift your head. Let the muscles of your belly relax. Point your tailbone up toward the ceiling so your back forms a sagging arch like the back of a cow. 5. Stay in this position for 5 seconds.  Press-ups Do these steps 5-10 times in a row: 1. Lie on your belly (face-down) on the floor. 2. Place your hands near your head, about shoulder-width apart. 3. While you keep your back relaxed and keep your hips on the floor, slowly straighten your arms to raise the top half of your body and lift your shoulders. Do not use your back muscles. You may change where you place your hands in order to make yourself more comfortable. 4. Stay in this position for 5 seconds. 5. Slowly return to lying flat on the floor.  Bridges Do these steps 10 times in a row: 1. Lie on your back on a firm surface. 2. Bend your knees so they point up to the ceiling. Your feet should be flat on the floor. Your arms should be flat at your sides, next  to your body. 3. Tighten your butt muscles and lift your butt off the floor until your waist is almost as high as your knees. If you do not feel the muscles working in your butt and the back of your thighs, slide your feet 1-2 inches farther away from your butt. 4. Stay in this position for 3-5 seconds. 5. Slowly lower your butt to the floor, and let your butt muscles relax. If this exercise is too easy, try doing it with your arms crossed over your chest. Belly crunches Do these steps 5-10 times in a row: 1. Lie on your back on a firm bed or the floor with your legs stretched out. 2. Bend your knees so they point up to the ceiling. Your feet should be flat on the floor. 3. Cross your arms over your chest. 4. Tip your chin a little bit  toward your chest but do not bend your neck. 5. Tighten your belly muscles and slowly raise your chest just enough to lift your shoulder blades a tiny bit off of the floor. Avoid raising your body higher than that, because it can put too much stress on your low back. 6. Slowly lower your chest and your head to the floor. Back lifts Do these steps 5-10 times in a row: 1. Lie on your belly (face-down) with your arms at your sides, and rest your forehead on the floor. 2. Tighten the muscles in your legs and your butt. 3. Slowly lift your chest off of the floor while you keep your hips on the floor. Keep the back of your head in line with the curve in your back. Look at the floor while you do this. 4. Stay in this position for 3-5 seconds. 5. Slowly lower your chest and your face to the floor. Contact a doctor if:  Your back pain gets a lot worse when you do an exercise.  Your back pain does not get better 2 hours after you exercise. If you have any of these problems, stop doing the exercises. Do not do them again unless your doctor says it is okay. Get help right away if:  You have sudden, very bad back pain. If this happens, stop doing the exercises. Do not do them again unless your doctor says it is okay. This information is not intended to replace advice given to you by your health care provider. Make sure you discuss any questions you have with your health care provider. Document Released: 07/04/2010 Document Revised: 02/24/2018 Document Reviewed: 02/24/2018 Elsevier Patient Education  2020 [employer].

## 2019-04-05 NOTE — Telephone Encounter (Signed)
I'm not sure what this means. We talked about her having a muscle relaxer for evening. This is the muscle relaxer.

## 2019-04-05 NOTE — Telephone Encounter (Signed)
Patient is checking status to see if PCP can change medication, Call back [phone]

## 2019-04-05 NOTE — Assessment & Plan Note (Signed)
Depo-medrol 40 mg IM given at visit. Rx meloxicam and flexeril. Advised on exercises and if not improving in 1-2 weeks will refer for PT. She is reminded that her need to do exercises to prevent back problems will likely be needed lifelong. Encouraged to do some more regular exercise.

## 2019-04-06 MED ORDER — METHOCARBAMOL 500 MG PO TABS: 500.0000 mg | ORAL_TABLET | Freq: Two times a day (BID) | ORAL | 0 refills | Status: DC | PRN

## 2019-04-06 NOTE — Telephone Encounter (Signed)
LVM with MD response  

## 2019-04-06 NOTE — Telephone Encounter (Signed)
Have sent in a different muscle relaxer but it is very likely to do the same thing and I would recommend to start with 1/2 pill in the evening only.

## 2019-04-16 ENCOUNTER — Telehealth: Payer: BC Managed Care – PPO | Admitting: Physician Assistant

## 2019-04-16 ENCOUNTER — Encounter: Payer: Self-pay | Admitting: Physician Assistant

## 2019-04-16 DIAGNOSIS — N390 Urinary tract infection, site not specified: Secondary | ICD-10-CM | POA: Diagnosis not present

## 2019-04-16 MED ORDER — NITROFURANTOIN MONOHYD MACRO 100 MG PO CAPS: 100.0000 mg | ORAL_CAPSULE | Freq: Two times a day (BID) | ORAL | 0 refills | Status: DC

## 2019-04-16 NOTE — Progress Notes (Signed)
We are sorry that you are not feeling well.  Here is how we plan to help!  Based on what you shared with me it looks like you most likely have a simple urinary tract infection.  A UTI (Urinary Tract Infection) is a bacterial infection of the bladder.  Most cases of urinary tract infections are simple to treat but a key part of your care is to encourage you to drink plenty of fluids and watch your symptoms carefully.  I have prescribed MacroBid 100 mg twice a day for 5 days.  Your symptoms should gradually improve. Call us if the burning in your urine worsens, you develop worsening fever, back pain or pelvic pain or if your symptoms do not resolve after completing the antibiotic.  Urinary tract infections can be prevented by drinking plenty of water to keep your body hydrated.  Also be sure when you wipe, wipe from front to back and don't hold it in!  If possible, empty your bladder every 4 hours.  Your e-visit answers were reviewed by a board certified advanced clinical practitioner to complete your personal care plan.  Depending on the condition, your plan could have included both over the counter or prescription medications.  If there is a problem please reply  once you have received a response from your provider.  Your safety is important to us.  If you have drug allergies check your prescription carefully.    You can use MyChart to ask questions about today's visit, request a non-urgent call back, or ask for a work or school excuse for 24 hours related to this e-Visit. If it has been greater than 24 hours you will need to follow up with your provider, or enter a new e-Visit to address those concerns.   You will get an e-mail in the next two days asking about your experience.  I hope that your e-visit has been valuable and will speed your recovery. Thank you for using e-visits.   I spent 5-10 minutes on review and completion of this note- Sahar Osman PAC  

## 2019-05-22 ENCOUNTER — Telehealth: Payer: BC Managed Care – PPO

## 2019-05-22 DIAGNOSIS — Z882 Allergy status to sulfonamides status: Secondary | ICD-10-CM | POA: Diagnosis not present

## 2019-05-22 DIAGNOSIS — G43909 Migraine, unspecified, not intractable, without status migrainosus: Secondary | ICD-10-CM | POA: Diagnosis not present

## 2019-05-22 DIAGNOSIS — R519 Headache, unspecified: Secondary | ICD-10-CM | POA: Diagnosis not present

## 2019-05-24 ENCOUNTER — Other Ambulatory Visit: Payer: Self-pay

## 2019-05-24 ENCOUNTER — Ambulatory Visit (INDEPENDENT_AMBULATORY_CARE_PROVIDER_SITE_OTHER): Payer: BC Managed Care – PPO | Admitting: Internal Medicine

## 2019-05-24 ENCOUNTER — Telehealth: Payer: Self-pay

## 2019-05-24 ENCOUNTER — Encounter: Payer: Self-pay | Admitting: Internal Medicine

## 2019-05-24 VITALS — BP 110/70 | HR 75 | Temp 97.6°F | Ht 64.0 in | Wt 185.0 lb

## 2019-05-24 DIAGNOSIS — M542 Cervicalgia: Secondary | ICD-10-CM

## 2019-05-24 DIAGNOSIS — M545 Low back pain, unspecified: Secondary | ICD-10-CM

## 2019-05-24 MED ORDER — KETOROLAC TROMETHAMINE 30 MG/ML IJ SOLN
30.0000 mg | Freq: Once | INTRAMUSCULAR | Status: AC
  Administered 2019-05-24: 30 mg via INTRAMUSCULAR

## 2019-05-24 MED ORDER — MELOXICAM 15 MG PO TABS: 15.0000 mg | ORAL_TABLET | Freq: Every day | ORAL | 3 refills | Status: DC

## 2019-05-24 NOTE — Telephone Encounter (Signed)
Before patient left was inquiring about a different muscle relaxer. Stated that the flexeril makes it to where she cant function she was taking robaxin but it wasn't strong enough. Wanted to know if there was something between the two to help that she could get instead

## 2019-05-24 NOTE — Progress Notes (Signed)
   Subjective:   Patient ID: Dawn Porter, female    DOB: [birth year], 28 y.o.   MRN: [id number]  HPI The patient is a 28 YO female coming in for neck and back pain. Started years ago. Was taking meloxicam daily which was helping well. She ran out several weeks ago and the pain has increased since. She is taking naproxen, tylenol and heat which is not working as well. She is up about 40 pounds since last year which she links to increase in pain. Had an injury about 1 week ago which caused more neck pain.  Review of Systems  Constitutional: Positive for activity change. Negative for appetite change, chills, fatigue, fever and unexpected weight change.  Respiratory: Negative.   Cardiovascular: Negative.   Gastrointestinal: Negative.   Musculoskeletal: Positive for arthralgias, back pain and myalgias. Negative for gait problem and joint swelling.  Skin: Negative.   Neurological: Negative.     Objective:  Physical Exam Constitutional:      Appearance: She is well-developed.  HENT:     Head: Normocephalic and atraumatic.  Neck:     Musculoskeletal: Normal range of motion.  Cardiovascular:     Rate and Rhythm: Normal rate and regular rhythm.  Pulmonary:     Effort: Pulmonary effort is normal. No respiratory distress.     Breath sounds: Normal breath sounds. No wheezing or rales.  Abdominal:     General: Bowel sounds are normal. There is no distension.     Palpations: Abdomen is soft.     Tenderness: There is no abdominal tenderness. There is no rebound.  Musculoskeletal:        General: Tenderness present.  Skin:    General: Skin is warm and dry.  Neurological:     Mental Status: She is alert and oriented to person, place, and time.     Coordination: Coordination normal.     Vitals:   05/24/19 0802  BP: 110/70  Pulse: 75  Temp: 97.6 F (36.4 C)  TempSrc: Oral  SpO2: 99%  Weight: 185 lb (83.9 kg)  Height: 5\' 4"  (1.626 m)   Visit time 25 minutes: greater than 50% of  that time was spent in face to face counseling and coordination of care with the patient: counseled about back and neck pain, need for weight loss and exercise to help  This visit occurred during the SARS-CoV-2 public health emergency.  Safety protocols were in place, including screening questions prior to the visit, additional usage of staff PPE, and extensive cleaning of exam room while observing appropriate contact time as indicated for disinfecting solutions.   Assessment & Plan:  Toradol 30 mg IM given at visit

## 2019-05-24 NOTE — Assessment & Plan Note (Signed)
Refilled meloxicam and given depo-medrol 30 mg IM. Advised about exercise and weight reduction to help also.

## 2019-05-24 NOTE — Patient Instructions (Addendum)
We have given you the shot today and have refilled the meloxicam.   Try doing some videos to get active or walk in place while walking tv or show 30 minutes a day.

## 2019-05-24 NOTE — Assessment & Plan Note (Signed)
Refilled meloxicam and given toradol 30 mg IM.

## 2019-05-25 MED ORDER — METHOCARBAMOL 750 MG PO TABS: 750.0000 mg | ORAL_TABLET | Freq: Two times a day (BID) | ORAL | 2 refills | Status: DC | PRN

## 2019-05-25 NOTE — Addendum Note (Signed)
Addended by: Pricilla Holm A on: 05/25/2019 07:25 AM   Modules accepted: Orders

## 2019-05-25 NOTE — Telephone Encounter (Signed)
LVM informing patient.

## 2019-05-25 NOTE — Telephone Encounter (Signed)
There is a stronger dosage of robaxin which I have sent in which may help more with pain.

## 2019-05-29 DIAGNOSIS — F32A Depression, unspecified: Secondary | ICD-10-CM | POA: Insufficient documentation

## 2019-05-29 DIAGNOSIS — M542 Cervicalgia: Secondary | ICD-10-CM | POA: Diagnosis not present

## 2019-05-29 DIAGNOSIS — M545 Low back pain: Secondary | ICD-10-CM | POA: Diagnosis not present

## 2019-06-14 DIAGNOSIS — Z20828 Contact with and (suspected) exposure to other viral communicable diseases: Secondary | ICD-10-CM | POA: Diagnosis not present

## 2019-07-05 DIAGNOSIS — F603 Borderline personality disorder: Secondary | ICD-10-CM | POA: Diagnosis not present

## 2019-07-20 ENCOUNTER — Other Ambulatory Visit: Payer: Self-pay

## 2019-07-21 ENCOUNTER — Encounter: Payer: Self-pay | Admitting: Obstetrics and Gynecology

## 2019-07-21 ENCOUNTER — Ambulatory Visit: Payer: BC Managed Care – PPO | Admitting: Obstetrics and Gynecology

## 2019-07-21 VITALS — BP 118/76

## 2019-07-21 DIAGNOSIS — N912 Amenorrhea, unspecified: Secondary | ICD-10-CM | POA: Diagnosis not present

## 2019-07-21 DIAGNOSIS — Z975 Presence of (intrauterine) contraceptive device: Secondary | ICD-10-CM

## 2019-07-21 DIAGNOSIS — B3731 Acute candidiasis of vulva and vagina: Secondary | ICD-10-CM

## 2019-07-21 DIAGNOSIS — R6882 Decreased libido: Secondary | ICD-10-CM | POA: Diagnosis not present

## 2019-07-21 DIAGNOSIS — B373 Candidiasis of vulva and vagina: Secondary | ICD-10-CM | POA: Diagnosis not present

## 2019-07-21 DIAGNOSIS — R635 Abnormal weight gain: Secondary | ICD-10-CM

## 2019-07-21 DIAGNOSIS — N898 Other specified noninflammatory disorders of vagina: Secondary | ICD-10-CM | POA: Diagnosis not present

## 2019-07-21 DIAGNOSIS — T839XXA Unspecified complication of genitourinary prosthetic device, implant and graft, initial encounter: Secondary | ICD-10-CM

## 2019-07-21 LAB — PREGNANCY, URINE: Preg Test, Ur: NEGATIVE

## 2019-07-21 LAB — WET PREP FOR TRICH, YEAST, CLUE

## 2019-07-21 MED ORDER — FLUCONAZOLE 150 MG PO TABS: 150.0000 mg | ORAL_TABLET | Freq: Once | ORAL | 0 refills | Status: AC

## 2019-07-21 NOTE — Patient Instructions (Signed)
Consider checking a monthly pregnancy test if not getting a period Discuss Prozac dosing and loss of libido with your mental health specialist Do your best to get regular exercise and choosing a healthy and balanced diet If after these things the symptoms are not improving, you may want to consider trying non-hormonal options for birth control (paragard IUD?)
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
## 2019-08-22 DIAGNOSIS — F339 Major depressive disorder, recurrent, unspecified: Secondary | ICD-10-CM | POA: Diagnosis not present

## 2019-08-22 DIAGNOSIS — F411 Generalized anxiety disorder: Secondary | ICD-10-CM | POA: Diagnosis not present

## 2019-08-22 DIAGNOSIS — F4310 Post-traumatic stress disorder, unspecified: Secondary | ICD-10-CM | POA: Diagnosis not present

## 2019-08-22 DIAGNOSIS — F410 Panic disorder [episodic paroxysmal anxiety] without agoraphobia: Secondary | ICD-10-CM | POA: Diagnosis not present

## 2019-08-24 ENCOUNTER — Other Ambulatory Visit: Payer: Self-pay

## 2019-08-24 ENCOUNTER — Ambulatory Visit (INDEPENDENT_AMBULATORY_CARE_PROVIDER_SITE_OTHER): Payer: BC Managed Care – PPO | Admitting: Internal Medicine

## 2019-08-24 ENCOUNTER — Encounter: Payer: Self-pay | Admitting: Internal Medicine

## 2019-08-24 VITALS — BP 112/66 | HR 81 | Temp 98.3°F | Ht 64.0 in | Wt 184.6 lb

## 2019-08-24 DIAGNOSIS — M545 Low back pain, unspecified: Secondary | ICD-10-CM

## 2019-08-24 DIAGNOSIS — R202 Paresthesia of skin: Secondary | ICD-10-CM | POA: Diagnosis not present

## 2019-08-24 DIAGNOSIS — R631 Polydipsia: Secondary | ICD-10-CM

## 2019-08-24 LAB — HEMOGLOBIN A1C: Hgb A1c MFr Bld: 5.5 % (ref 4.6–6.5)

## 2019-08-24 LAB — VITAMIN B12: Vitamin B-12: 503 pg/mL (ref 211–911)

## 2019-08-24 LAB — T4, FREE: Free T4: 0.73 ng/dL (ref 0.60–1.60)

## 2019-08-24 LAB — TSH: TSH: 1.48 u[IU]/mL (ref 0.35–4.50)

## 2019-08-24 LAB — VITAMIN D 25 HYDROXY (VIT D DEFICIENCY, FRACTURES): VITD: 29.48 ng/mL — ABNORMAL LOW (ref 30.00–100.00)

## 2019-08-24 MED ORDER — KETOROLAC TROMETHAMINE 30 MG/ML IJ SOLN
30.0000 mg | Freq: Once | INTRAMUSCULAR | Status: AC
  Administered 2019-08-24: 30 mg via INTRAMUSCULAR

## 2019-08-24 MED ORDER — METHYLPREDNISOLONE ACETATE 40 MG/ML IJ SUSP
40.0000 mg | Freq: Once | INTRAMUSCULAR | Status: AC
  Administered 2019-08-24: 40 mg via INTRAMUSCULAR

## 2019-08-24 NOTE — Assessment & Plan Note (Signed)
Given depo-medrol 40 mg IM and toradol 30 mg IM at visit.

## 2019-08-24 NOTE — Progress Notes (Signed)
   Subjective:   Patient ID: Dawn Porter, female    DOB: 08-02-1990, 29 y.o.   MRN: [id number]  HPI The patient is a 29 YO female coming in for concerns about pre-diabetes. She is taking rexulti at this time due to worsening mental health with pandemic and this is helping. Since starting she is having increased thirst and urination, tingling in feet. She thought that this was medication side effect but her psych told her this could be pre-diabetes. She is concerned about this. Denies fevers or chills. She is also having problems with low back pain worsening. Has been doing stretching at home still. Denies injury recent but has old trauma to the back. Taking otc ibuprofen without significant long term relief.  Review of Systems  Constitutional: Negative.   HENT: Negative.   Eyes: Negative.   Respiratory: Negative for cough, chest tightness and shortness of breath.   Cardiovascular: Negative for chest pain, palpitations and leg swelling.  Gastrointestinal: Negative for abdominal distention, abdominal pain, constipation, diarrhea, nausea and vomiting.  Endocrine: Positive for cold intolerance, heat intolerance, polydipsia and polyuria.  Musculoskeletal: Positive for back pain and myalgias.  Skin: Negative.   Neurological: Negative.        Tingling in feet  Psychiatric/Behavioral: Negative.     Objective:  Physical Exam Constitutional:      Appearance: She is well-developed.  HENT:     Head: Normocephalic and atraumatic.  Cardiovascular:     Rate and Rhythm: Normal rate and regular rhythm.  Pulmonary:     Effort: Pulmonary effort is normal. No respiratory distress.     Breath sounds: Normal breath sounds. No wheezing or rales.  Abdominal:     General: Bowel sounds are normal. There is no distension.     Palpations: Abdomen is soft.     Tenderness: There is no abdominal tenderness. There is no rebound.  Musculoskeletal:        General: Tenderness present.     Cervical back:  Normal range of motion.  Skin:    General: Skin is warm and dry.  Neurological:     Mental Status: She is alert and oriented to person, place, and time.     Coordination: Coordination normal.     Vitals:   08/24/19 0837  BP: 112/66  Pulse: 81  Temp: 98.3 F (36.8 C)  TempSrc: Oral  SpO2: 99%  Weight: 184 lb 9.6 oz (83.7 kg)  Height: 5\' 4"  (1.626 m)    This visit occurred during the SARS-CoV-2 public health emergency.  Safety protocols were in place, including screening questions prior to the visit, additional usage of staff PPE, and extensive cleaning of exam room while observing appropriate contact time as indicated for disinfecting solutions.   Assessment & Plan:  Toradol 30 mg IM and depo-medrol 40 mg IM given at visit

## 2019-08-24 NOTE — Patient Instructions (Signed)
We will check the labs today for the sugars.   We have given you the shots to help with the labs.

## 2019-08-24 NOTE — Assessment & Plan Note (Signed)
Checking HgA1c, thyroid, vitamin levels and adjust as needed.

## 2019-08-24 NOTE — Assessment & Plan Note (Signed)
Checking HgA1c and random insulin level. Educated on these tests and possible results during visit as well as increased risk for sugar problems with her mental health medications.

## 2019-08-28 ENCOUNTER — Telehealth: Payer: Self-pay | Admitting: Podiatry

## 2019-08-28 NOTE — Telephone Encounter (Signed)
Pt called requesting to have her medical records released to another doctor in [place]. I told pt she would need to fill out and sign a medical records release form for [place] to release her records. Pt requested the form be e-mailed to her at [email or web address].

## 2019-08-30 LAB — INSULIN, FREE (BIOACTIVE): Insulin, Free: 11 u[IU]/mL (ref 1.5–14.9)

## 2019-09-05 ENCOUNTER — Ambulatory Visit: Payer: BC Managed Care – PPO | Admitting: Women's Health

## 2019-09-12 ENCOUNTER — Telehealth: Payer: BC Managed Care – PPO | Admitting: Physician Assistant

## 2019-09-12 DIAGNOSIS — J302 Other seasonal allergic rhinitis: Secondary | ICD-10-CM

## 2019-09-12 MED ORDER — LEVOCETIRIZINE DIHYDROCHLORIDE 5 MG PO TABS: 5.0000 mg | ORAL_TABLET | Freq: Every evening | ORAL | 0 refills | Status: AC

## 2019-09-12 MED ORDER — FEXOFENADINE HCL 60 MG PO TABS: 60.0000 mg | ORAL_TABLET | Freq: Two times a day (BID) | ORAL | 0 refills | Status: DC

## 2019-09-12 NOTE — Addendum Note (Signed)
Addended by: Rodney Booze on: 09/12/2019 09:52 AM   Modules accepted: Orders

## 2019-09-12 NOTE — Progress Notes (Signed)
E visit for Allergic Rhinitis We are sorry that you are not feeling well.  Here is how we plan to help!  Based on what you have shared with me it looks like you have Allergic Rhinitis.  Rhinitis is when a reaction occurs that causes nasal congestion, runny nose, sneezing, and itching.  Most types of rhinitis are caused by an inflammation and are associated with symptoms in the eyes ears or throat. There are several types of rhinitis.  The most common are acute rhinitis, which is usually caused by a viral illness, allergic or seasonal rhinitis, and nonallergic or year-round rhinitis.  Nasal allergies occur certain times of the year.  Allergic rhinitis is caused when allergens in the air trigger the release of histamine in the body.  Histamine causes itching, swelling, and fluid to build up in the fragile linings of the nasal passages, sinuses and eyelids.  An itchy nose and clear discharge are common.  I recommend the following over the counter treatments: Allegra 60 mg twice daily  I also would recommend continuing to use your nasal spray. Additionally, I would not use both Claritin and Zyrtec at the same time. I would choose whichever one you feel is more effective and stick with that.   HOME CARE:   You can use an over-the-counter saline nasal spray as needed  Avoid areas where there is heavy dust, mites, or molds  Stay indoors on windy days during the pollen season  Keep windows closed in home, at least in bedroom; use air conditioner.  Use high-efficiency house air filter  Keep windows closed in car, turn AC on re-circulate  Avoid playing out with dog during pollen season  GET HELP RIGHT AWAY IF:   If your symptoms do not improve within 10 days  You become short of breath  You develop yellow or green discharge from your nose for over 3 days  You have coughing fits  MAKE SURE YOU:   Understand these instructions  Will watch your condition  Will get help right away if  you are not doing well or get worse  Thank you for choosing an e-visit. Your e-visit answers were reviewed by a board certified advanced clinical practitioner to complete your personal care plan. Depending upon the condition, your plan could have included both over the counter or prescription medications. Please review your pharmacy choice. Be sure that the pharmacy you have chosen is open so that you can pick up your prescription now.  If there is a problem you may message your provider in [place] to have the prescription routed to another pharmacy. Your safety is important to [place]. If you have drug allergies check your prescription carefully.  For the next 24 hours, you can use MyChart to ask questions about today's visit, request a non-urgent call back, or ask for a work or school excuse from your e-visit provider. You will get an email in the next two days asking about your experience. I hope that your e-visit has been valuable and will speed your recovery.   Greater than 5 minutes, yet less than 10 minutes of time have been spend researching, coordinating, and implementing care for this patient today.

## 2019-09-20 DIAGNOSIS — M79672 Pain in left foot: Secondary | ICD-10-CM | POA: Diagnosis not present

## 2019-09-21 DIAGNOSIS — M79672 Pain in left foot: Secondary | ICD-10-CM | POA: Diagnosis not present

## 2019-10-04 DIAGNOSIS — Z03818 Encounter for observation for suspected exposure to other biological agents ruled out: Secondary | ICD-10-CM | POA: Diagnosis not present

## 2019-10-06 DIAGNOSIS — M2040 Other hammer toe(s) (acquired), unspecified foot: Secondary | ICD-10-CM | POA: Diagnosis not present

## 2019-10-06 DIAGNOSIS — M2042 Other hammer toe(s) (acquired), left foot: Secondary | ICD-10-CM | POA: Diagnosis not present

## 2019-10-06 DIAGNOSIS — M205X2 Other deformities of toe(s) (acquired), left foot: Secondary | ICD-10-CM | POA: Diagnosis not present

## 2019-10-06 DIAGNOSIS — Z1159 Encounter for screening for other viral diseases: Secondary | ICD-10-CM | POA: Diagnosis not present

## 2019-10-06 DIAGNOSIS — G8918 Other acute postprocedural pain: Secondary | ICD-10-CM | POA: Diagnosis not present

## 2019-10-06 DIAGNOSIS — Z4889 Encounter for other specified surgical aftercare: Secondary | ICD-10-CM | POA: Insufficient documentation

## 2019-10-06 DIAGNOSIS — Z472 Encounter for removal of internal fixation device: Secondary | ICD-10-CM | POA: Diagnosis not present

## 2019-10-12 ENCOUNTER — Encounter: Payer: BC Managed Care – PPO | Admitting: Obstetrics & Gynecology

## 2019-10-17 ENCOUNTER — Encounter: Payer: BC Managed Care – PPO | Admitting: Obstetrics & Gynecology

## 2019-10-30 DIAGNOSIS — R1013 Epigastric pain: Secondary | ICD-10-CM | POA: Diagnosis not present

## 2019-10-30 DIAGNOSIS — E669 Obesity, unspecified: Secondary | ICD-10-CM | POA: Diagnosis not present

## 2019-10-30 DIAGNOSIS — Z3202 Encounter for pregnancy test, result negative: Secondary | ICD-10-CM | POA: Diagnosis not present

## 2019-10-30 DIAGNOSIS — R3121 Asymptomatic microscopic hematuria: Secondary | ICD-10-CM | POA: Diagnosis not present

## 2019-10-30 DIAGNOSIS — Z20822 Contact with and (suspected) exposure to covid-19: Secondary | ICD-10-CM | POA: Diagnosis not present

## 2019-10-31 DIAGNOSIS — J301 Allergic rhinitis due to pollen: Secondary | ICD-10-CM | POA: Diagnosis not present

## 2019-10-31 DIAGNOSIS — H1013 Acute atopic conjunctivitis, bilateral: Secondary | ICD-10-CM | POA: Diagnosis not present

## 2019-10-31 DIAGNOSIS — J3081 Allergic rhinitis due to animal (cat) (dog) hair and dander: Secondary | ICD-10-CM | POA: Diagnosis not present

## 2019-10-31 DIAGNOSIS — J3089 Other allergic rhinitis: Secondary | ICD-10-CM | POA: Diagnosis not present

## 2019-11-10 ENCOUNTER — Ambulatory Visit (INDEPENDENT_AMBULATORY_CARE_PROVIDER_SITE_OTHER): Payer: BC Managed Care – PPO | Admitting: Internal Medicine

## 2019-11-10 ENCOUNTER — Encounter: Payer: Self-pay | Admitting: Internal Medicine

## 2019-11-10 ENCOUNTER — Other Ambulatory Visit: Payer: Self-pay

## 2019-11-10 VITALS — BP 118/70 | HR 101 | Temp 98.4°F | Ht 64.0 in | Wt 197.0 lb

## 2019-11-10 DIAGNOSIS — M79672 Pain in left foot: Secondary | ICD-10-CM

## 2019-11-10 DIAGNOSIS — M545 Low back pain, unspecified: Secondary | ICD-10-CM

## 2019-11-10 DIAGNOSIS — M79662 Pain in left lower leg: Secondary | ICD-10-CM | POA: Diagnosis not present

## 2019-11-10 MED ORDER — METHYLPREDNISOLONE ACETATE 40 MG/ML IJ SUSP
40.0000 mg | Freq: Once | INTRAMUSCULAR | Status: AC
  Administered 2019-11-10: 40 mg via INTRAMUSCULAR

## 2019-11-10 MED ORDER — KETOROLAC TROMETHAMINE 30 MG/ML IJ SOLN
30.0000 mg | Freq: Once | INTRAMUSCULAR | Status: AC
  Administered 2019-11-10: 30 mg via INTRAMUSCULAR

## 2019-11-10 NOTE — Assessment & Plan Note (Signed)
Depo-medrol 40 mg IM and toradol 30 mg IM given at visit for flare of low back pain after surgery.

## 2019-11-10 NOTE — Patient Instructions (Signed)
We have given you the shots today which should help the back.  Likely the numbness in the leg will gradually get better over 3-6 months.

## 2019-11-10 NOTE — Assessment & Plan Note (Signed)
Suspect numbness due to nerve compression due to medication from nerve block. Likely will resolve gradually in 3-6 months. If progression or worsening will check MRI lumbar given concurrent low back pain.

## 2019-11-10 NOTE — Assessment & Plan Note (Signed)
Resolved after surgery and she is pleased with outcome. 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 Staying active and moving around often.

## 2019-11-10 NOTE — Progress Notes (Signed)
   Subjective:   Patient ID: Dawn Porter, female    DOB: 06-Apr-1991, 29 y.o.   MRN: [id number]  HPI The patient is a 29 YO female coming in for numbness left shin (started right after surgery, had nerve block in that location, has not worn off after surgery 4 weeks ago, called surgeon and they told her to see primary care, no pain per say, numbness is on the shin, not the sides or back of the leg, no calf swelling or tenderness) and low back pain (prior depo-medrol 40 and toradol 30 did help for 1-2 months, she was immobilized for a week or two after foot surgery and now has been trying to get back to exercise and now hurting much worse, denies any radiation of the pain, does have new shin numbness right after surgery) and left foot pain (had surgery about 1 month ago and foot is doing much better, she is getting back to exercise).   Review of Systems  Constitutional: Negative.   HENT: Negative.   Eyes: Negative.   Respiratory: Negative for cough, chest tightness and shortness of breath.   Cardiovascular: Negative for chest pain, palpitations and leg swelling.  Gastrointestinal: Negative for abdominal distention, abdominal pain, constipation, diarrhea, nausea and vomiting.  Musculoskeletal: Positive for arthralgias, back pain and myalgias.  Skin: Negative.   Neurological: Positive for numbness.  Psychiatric/Behavioral: Negative.     Objective:  Physical Exam Constitutional:      Appearance: She is well-developed.  HENT:     Head: Normocephalic and atraumatic.  Cardiovascular:     Rate and Rhythm: Normal rate and regular rhythm.  Pulmonary:     Effort: Pulmonary effort is normal. No respiratory distress.     Breath sounds: Normal breath sounds. No wheezing or rales.  Abdominal:     General: Bowel sounds are normal. There is no distension.     Palpations: Abdomen is soft.     Tenderness: There is no abdominal tenderness. There is no rebound.  Musculoskeletal:        General:  Tenderness present.     Cervical back: Normal range of motion.     Comments: Low back midline tenderness  Skin:    General: Skin is warm and dry.  Neurological:     Mental Status: She is alert and oriented to person, place, and time.     Cranial Nerves: Cranial nerve deficit present.     Coordination: Coordination normal.     Comments: Numbness with some light sensation to touch on the left shin, left lateral and medial leg normal sensation, left calf with normal sensation and no pain     Vitals:   11/10/19 1623  BP: 118/70  Pulse: (!) 101  Temp: 98.4 F (36.9 C)  SpO2: 99%  Weight: 197 lb (89.4 kg)  Height: 5\' 4"  (1.626 m)    This visit occurred during the SARS-CoV-2 public health emergency.  Safety protocols were in place, including screening questions prior to the visit, additional usage of staff PPE, and extensive cleaning of exam room while observing appropriate contact time as indicated for disinfecting solutions.   Assessment & Plan:

## 2019-11-20 DIAGNOSIS — J301 Allergic rhinitis due to pollen: Secondary | ICD-10-CM | POA: Diagnosis not present

## 2019-11-20 DIAGNOSIS — J3089 Other allergic rhinitis: Secondary | ICD-10-CM | POA: Diagnosis not present

## 2019-11-20 DIAGNOSIS — J3081 Allergic rhinitis due to animal (cat) (dog) hair and dander: Secondary | ICD-10-CM | POA: Diagnosis not present

## 2019-11-21 ENCOUNTER — Encounter: Payer: Self-pay | Admitting: Internal Medicine

## 2019-11-21 DIAGNOSIS — R1084 Generalized abdominal pain: Secondary | ICD-10-CM | POA: Diagnosis not present

## 2019-11-21 DIAGNOSIS — N3000 Acute cystitis without hematuria: Secondary | ICD-10-CM | POA: Diagnosis not present

## 2019-11-21 DIAGNOSIS — R918 Other nonspecific abnormal finding of lung field: Secondary | ICD-10-CM | POA: Diagnosis not present

## 2019-11-21 DIAGNOSIS — Z91040 Latex allergy status: Secondary | ICD-10-CM | POA: Diagnosis not present

## 2019-11-21 DIAGNOSIS — Z79899 Other long term (current) drug therapy: Secondary | ICD-10-CM | POA: Diagnosis not present

## 2019-11-21 DIAGNOSIS — R110 Nausea: Secondary | ICD-10-CM | POA: Diagnosis not present

## 2019-11-21 DIAGNOSIS — R109 Unspecified abdominal pain: Secondary | ICD-10-CM | POA: Diagnosis not present

## 2019-11-21 DIAGNOSIS — R197 Diarrhea, unspecified: Secondary | ICD-10-CM | POA: Diagnosis not present

## 2019-11-21 DIAGNOSIS — N3090 Cystitis, unspecified without hematuria: Secondary | ICD-10-CM | POA: Diagnosis not present

## 2019-11-21 DIAGNOSIS — Z91041 Radiographic dye allergy status: Secondary | ICD-10-CM | POA: Diagnosis not present

## 2019-11-28 DIAGNOSIS — J3089 Other allergic rhinitis: Secondary | ICD-10-CM | POA: Diagnosis not present

## 2019-11-28 DIAGNOSIS — J301 Allergic rhinitis due to pollen: Secondary | ICD-10-CM | POA: Diagnosis not present

## 2019-11-28 DIAGNOSIS — J3081 Allergic rhinitis due to animal (cat) (dog) hair and dander: Secondary | ICD-10-CM | POA: Diagnosis not present

## 2019-11-29 ENCOUNTER — Encounter: Payer: BC Managed Care – PPO | Admitting: Nurse Practitioner

## 2019-11-30 DIAGNOSIS — J301 Allergic rhinitis due to pollen: Secondary | ICD-10-CM | POA: Diagnosis not present

## 2019-11-30 DIAGNOSIS — J3089 Other allergic rhinitis: Secondary | ICD-10-CM | POA: Diagnosis not present

## 2019-11-30 DIAGNOSIS — J3081 Allergic rhinitis due to animal (cat) (dog) hair and dander: Secondary | ICD-10-CM | POA: Diagnosis not present

## 2019-12-01 ENCOUNTER — Other Ambulatory Visit: Payer: Self-pay

## 2019-12-04 ENCOUNTER — Encounter: Payer: Self-pay | Admitting: Nurse Practitioner

## 2019-12-04 ENCOUNTER — Other Ambulatory Visit: Payer: Self-pay

## 2019-12-04 ENCOUNTER — Ambulatory Visit (INDEPENDENT_AMBULATORY_CARE_PROVIDER_SITE_OTHER): Payer: BC Managed Care – PPO | Admitting: Nurse Practitioner

## 2019-12-04 VITALS — BP 122/78 | Ht 64.0 in | Wt 196.0 lb

## 2019-12-04 DIAGNOSIS — F4310 Post-traumatic stress disorder, unspecified: Secondary | ICD-10-CM

## 2019-12-04 DIAGNOSIS — Z975 Presence of (intrauterine) contraceptive device: Secondary | ICD-10-CM | POA: Diagnosis not present

## 2019-12-04 DIAGNOSIS — Z01419 Encounter for gynecological examination (general) (routine) without abnormal findings: Secondary | ICD-10-CM

## 2019-12-04 MED ORDER — LORAZEPAM 1 MG PO TABS: 1.0000 mg | ORAL_TABLET | Freq: Three times a day (TID) | ORAL | 0 refills | Status: DC
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
## 2019-12-04 NOTE — Patient Instructions (Addendum)
Contraception Choices Contraception, also called birth control, means things to use or ways to try not to get pregnant. Hormonal birth control This kind of birth control uses hormones. Here are some types of hormonal birth control:  A tube that is put under skin of the arm (implant). The tube can stay in for as long as 3 years.  Shots to get every 3 months (injections).  Pills to take every day (birth control pills).  A patch to change 1 time each week for 3 weeks (birth control patch). After that, the patch is taken off for 1 week.  A ring to put in the vagina. The ring is left in for 3 weeks. Then it is taken out of the vagina for 1 week. Then a new ring is put in.  Pills to take after unprotected sex (emergency birth control pills). Barrier birth control Here are some types of barrier birth control:  A thin covering that is put on the penis before sex (female condom). The covering is thrown away after sex.  A soft, loose covering that is put in the vagina before sex (female condom). The covering is thrown away after sex.  A rubber bowl that sits over the cervix (diaphragm). The bowl must be made for you. The bowl is put into the vagina before sex. The bowl is left in for 6-8 hours after sex. It is taken out within 24 hours.  A small, soft cup that fits over the cervix (cervical cap). The cup must be made for you. The cup can be left in for 6-8 hours after sex. It is taken out within 48 hours.  A sponge that is put into the vagina before sex. It must be left in for at least 6 hours after sex. It must be taken out within 30 hours. Then it is thrown away.  A chemical that kills or stops sperm from getting into the uterus (spermicide). It may be a pill, cream, jelly, or foam to put in the vagina. The chemical should be used at least 10-15 minutes before sex. IUD (intrauterine) birth control An IUD is a small, T-shaped piece of plastic. It is put inside the uterus. There are two  kinds:  Hormone IUD. This kind can stay in for 3-5 years.  Copper IUD. This kind can stay in for 10 years. Permanent birth control Here are some types of permanent birth control:  Surgery to block the fallopian tubes.  Having an insert put into each fallopian tube.  Surgery to tie off the tubes that carry sperm (vasectomy). Natural planning birth control Here are some types of natural planning birth control:  Not having sex on the days the woman could get pregnant.  Using a calendar: ? To keep track of the length of each period. ? To find out what days pregnancy can happen. ? To plan to not have sex on days when pregnancy can happen.  Watching for symptoms of ovulation and not having sex during ovulation. One way the woman can check for ovulation is to check her temperature.  Waiting to have sex until after ovulation. Summary  Contraception, also called birth control, means things to use or ways to try not to get pregnant.  Hormonal methods of birth control include implants, injections, pills, patches, vaginal rings, and emergency birth control pills.  Barrier methods of birth control can include female condoms, female condoms, diaphragms, cervical caps, sponges, and spermicides.  There are two types of IUD (intrauterine device) birth control.   An IUD can be put in a woman's uterus to prevent pregnancy for 3-5 years.  Permanent sterilization can be done through a procedure for males, females, or both.  Natural planning methods involve not having sex on the days when the woman could get pregnant. This information is not intended to replace advice given to you by your health care provider. Make sure you discuss any questions you have with your health care provider. Document Revised: 09/21/2018 Document Reviewed: 06/11/2016 Elsevier Patient Education  [place].  Intrauterine Device Information An intrauterine device (IUD) is a medical device that is inserted in the  uterus to prevent pregnancy. It is a small, T-shaped device that has one or two nylon strings hanging down from it. The strings hang out of the lower part of the uterus (cervix) to allow for future IUD removal. There are two types of IUDs available:  Hormone IUD. This type of IUD is made of plastic and contains the hormone progestin (synthetic progesterone). A hormone IUD may last 3-5 years.  Copper IUD. This type of IUD has copper wire wrapped around it. A copper IUD may last up to 10 years. How is an IUD inserted? An IUD is inserted through the vagina and placed into the uterus with a minor medical procedure. The exact procedure for IUD insertion may vary among health care providers and hospitals. How does an IUD work? Synthetic progesterone in a hormonal IUD prevents pregnancy by:  Thickening cervical mucus to prevent sperm from entering the uterus.  Thinning the uterine lining to prevent a fertilized egg from being implanted there. Copper in a copper IUD prevents pregnancy by making the uterus and fallopian tubes produce a fluid that kills sperm. What are the advantages of an IUD? Advantages of either type of IUD  It is highly effective in preventing pregnancy.  It is reversible. You can become pregnant shortly after the IUD is removed.  It is low-maintenance and can stay in place for a long time.  There are no estrogen-related side effects.  It can be used when breastfeeding.  It is not associated with weight gain.  It can be inserted right after childbirth, an abortion, or a miscarriage. Advantages of a hormone IUD  If it is inserted within 7 days of your period starting, it works right after it is inserted. If the hormone IUD is inserted at any other time in your cycle, you will need to use a backup method of birth control for 7 days after insertion.  It can make menstrual periods lighter.  It can reduce menstrual cramping.  It can be used for 3-5 years. Advantages of a  copper IUD  It works right after it is inserted.  It can be used as a form of emergency birth control if it is inserted within 5 days after having unprotected sex.  It does not interfere with your body's natural hormones.  It can be used for 10 years. What are the disadvantages of an IUD?  An IUD may cause irregular menstrual bleeding for a period of time after insertion.  You may have pain during insertion and have cramping and vaginal bleeding after insertion.  An IUD may cut the uterus (uterine perforation) when it is inserted. This is rare.  An IUD may cause pelvic inflammatory disease (PID), which is an infection in the uterus and fallopian tubes. This is rare, and it usually happens during the first 20 days after the IUD is inserted.  A copper IUD can  make your menstrual flow heavier and more painful. How is an IUD removed?  You will lie on your back with your knees bent and your feet in footrests (stirrups).  A device will be inserted into your vagina to spread apart the vaginal walls (speculum). This will allow your health care provider to see the strings attached to the IUD.  Your health care provider will use a small instrument (forceps) to grasp the IUD strings and pull firmly until the IUD is removed. You may have some discomfort when the IUD is removed. Your health care provider may recommend taking over-the-counter pain relievers, such as ibuprofen, before the procedure. You may also have minor spotting for a few days after the procedure. The exact procedure for IUD removal may vary among health care providers and hospitals. Is the IUD right for me? Your health care provider will make sure you are a good candidate for an IUD and will discuss the advantages, disadvantages, and possible side effects with you. Summary  An intrauterine device (IUD) is a medical device that is inserted in the uterus to prevent pregnancy. It is a small, T-shaped device that has one or two  nylon strings hanging down from it.  A hormone IUD contains the hormone progestin (synthetic progesterone). A copper IUD has copper wire wrapped around it.  Synthetic progesterone in a hormone IUD prevents pregnancy by thickening cervical mucus and thinning the walls of the uterus. Copper in a copper IUD prevents pregnancy by making the uterus and fallopian tubes produce a fluid that kills sperm.  A hormone IUD can be left in place for 3-5 years. A copper IUD can be left in place for up to 10 years.  An IUD is inserted and removed by a health care provider. You may feel some pain during insertion and removal. Your health care provider may recommend taking over-the-counter pain medicine, such as ibuprofen, before an IUD procedure. This information is not intended to replace advice given to you by your health care provider. Make sure you discuss any questions you have with your health care provider. Document Revised: 05/14/2017 Document Reviewed: 06/30/2016 Elsevier Patient Education  [place].

## 2019-12-05 DIAGNOSIS — J301 Allergic rhinitis due to pollen: Secondary | ICD-10-CM | POA: Diagnosis not present

## 2019-12-05 DIAGNOSIS — J3081 Allergic rhinitis due to animal (cat) (dog) hair and dander: Secondary | ICD-10-CM | POA: Diagnosis not present

## 2019-12-05 DIAGNOSIS — J3089 Other allergic rhinitis: Secondary | ICD-10-CM | POA: Diagnosis not present

## 2019-12-05 LAB — PAP, TP IMAGING W/ HPV RNA, RFLX HPV TYPE 16,18/45: HPV DNA High Risk: NOT DETECTED

## 2019-12-07 DIAGNOSIS — J3089 Other allergic rhinitis: Secondary | ICD-10-CM | POA: Diagnosis not present

## 2019-12-07 DIAGNOSIS — J301 Allergic rhinitis due to pollen: Secondary | ICD-10-CM | POA: Diagnosis not present

## 2019-12-07 DIAGNOSIS — J3081 Allergic rhinitis due to animal (cat) (dog) hair and dander: Secondary | ICD-10-CM | POA: Diagnosis not present

## 2019-12-12 DIAGNOSIS — J3089 Other allergic rhinitis: Secondary | ICD-10-CM | POA: Diagnosis not present

## 2019-12-12 DIAGNOSIS — J301 Allergic rhinitis due to pollen: Secondary | ICD-10-CM | POA: Diagnosis not present

## 2019-12-12 DIAGNOSIS — J3081 Allergic rhinitis due to animal (cat) (dog) hair and dander: Secondary | ICD-10-CM | POA: Diagnosis not present

## 2019-12-14 ENCOUNTER — Telehealth: Payer: Self-pay

## 2019-12-14 DIAGNOSIS — J3089 Other allergic rhinitis: Secondary | ICD-10-CM | POA: Diagnosis not present

## 2019-12-14 DIAGNOSIS — J3081 Allergic rhinitis due to animal (cat) (dog) hair and dander: Secondary | ICD-10-CM | POA: Diagnosis not present

## 2019-12-14 DIAGNOSIS — J301 Allergic rhinitis due to pollen: Secondary | ICD-10-CM | POA: Diagnosis not present

## 2019-12-14 NOTE — Telephone Encounter (Signed)
Patient called complaining of she has recently started working out again and has started having pelvic pain cramping, spotting and bleeding. She had previously not bled for a year with IUD.Spoke with patient and recommended office visit to assess. Transferred to appointment desk.

## 2019-12-19 ENCOUNTER — Ambulatory Visit: Payer: BC Managed Care – PPO | Admitting: Nurse Practitioner

## 2019-12-20 DIAGNOSIS — K921 Melena: Secondary | ICD-10-CM | POA: Diagnosis not present

## 2019-12-20 DIAGNOSIS — Z791 Long term (current) use of non-steroidal anti-inflammatories (NSAID): Secondary | ICD-10-CM | POA: Diagnosis not present

## 2019-12-20 DIAGNOSIS — K5900 Constipation, unspecified: Secondary | ICD-10-CM | POA: Diagnosis not present

## 2019-12-20 DIAGNOSIS — R1084 Generalized abdominal pain: Secondary | ICD-10-CM | POA: Diagnosis not present

## 2019-12-22 DIAGNOSIS — R12 Heartburn: Secondary | ICD-10-CM | POA: Diagnosis not present

## 2019-12-22 DIAGNOSIS — R1013 Epigastric pain: Secondary | ICD-10-CM | POA: Diagnosis not present

## 2019-12-22 DIAGNOSIS — K3189 Other diseases of stomach and duodenum: Secondary | ICD-10-CM | POA: Diagnosis not present

## 2019-12-26 DIAGNOSIS — J301 Allergic rhinitis due to pollen: Secondary | ICD-10-CM | POA: Diagnosis not present

## 2019-12-26 DIAGNOSIS — J3081 Allergic rhinitis due to animal (cat) (dog) hair and dander: Secondary | ICD-10-CM | POA: Diagnosis not present

## 2019-12-26 DIAGNOSIS — J3089 Other allergic rhinitis: Secondary | ICD-10-CM | POA: Diagnosis not present

## 2019-12-27 DIAGNOSIS — G8921 Chronic pain due to trauma: Secondary | ICD-10-CM | POA: Diagnosis not present

## 2019-12-27 DIAGNOSIS — G8929 Other chronic pain: Secondary | ICD-10-CM | POA: Diagnosis not present

## 2019-12-27 DIAGNOSIS — M79672 Pain in left foot: Secondary | ICD-10-CM | POA: Diagnosis not present

## 2019-12-27 DIAGNOSIS — M545 Low back pain: Secondary | ICD-10-CM | POA: Diagnosis not present

## 2019-12-27 DIAGNOSIS — Z79899 Other long term (current) drug therapy: Secondary | ICD-10-CM | POA: Diagnosis not present

## 2019-12-27 DIAGNOSIS — Z5181 Encounter for therapeutic drug level monitoring: Secondary | ICD-10-CM | POA: Diagnosis not present

## 2019-12-28 DIAGNOSIS — J301 Allergic rhinitis due to pollen: Secondary | ICD-10-CM | POA: Diagnosis not present

## 2019-12-28 DIAGNOSIS — J3081 Allergic rhinitis due to animal (cat) (dog) hair and dander: Secondary | ICD-10-CM | POA: Diagnosis not present

## 2019-12-28 DIAGNOSIS — J3089 Other allergic rhinitis: Secondary | ICD-10-CM | POA: Diagnosis not present

## 2020-01-02 ENCOUNTER — Encounter: Payer: Self-pay | Admitting: Obstetrics & Gynecology

## 2020-01-02 ENCOUNTER — Ambulatory Visit: Payer: BC Managed Care – PPO | Admitting: Obstetrics & Gynecology

## 2020-01-02 ENCOUNTER — Other Ambulatory Visit: Payer: Self-pay

## 2020-01-02 VITALS — BP 112/70

## 2020-01-02 DIAGNOSIS — Z3009 Encounter for other general counseling and advice on contraception: Secondary | ICD-10-CM | POA: Diagnosis not present

## 2020-01-02 DIAGNOSIS — J3081 Allergic rhinitis due to animal (cat) (dog) hair and dander: Secondary | ICD-10-CM | POA: Diagnosis not present

## 2020-01-02 DIAGNOSIS — H1013 Acute atopic conjunctivitis, bilateral: Secondary | ICD-10-CM | POA: Diagnosis not present

## 2020-01-02 DIAGNOSIS — Z30432 Encounter for removal of intrauterine contraceptive device: Secondary | ICD-10-CM

## 2020-01-02 DIAGNOSIS — J3089 Other allergic rhinitis: Secondary | ICD-10-CM | POA: Diagnosis not present

## 2020-01-02 DIAGNOSIS — J301 Allergic rhinitis due to pollen: Secondary | ICD-10-CM | POA: Diagnosis not present

## 2020-01-02 NOTE — Progress Notes (Signed)
    Dawn Porter Oct 20, 1990 [id number]        29 y.o.  [id number]  Boyfriend x 1 year  RP: Skyla IUD removal  HPI: Painful IC with Skyla IUD.  No BTB.  Normal vaginal secretions.  No fever. Desires IUD removal.  Will use condoms.  [place] if conceives.   OB History  Gravida Para Term Preterm AB Living  0 0 0 0 0 0  SAB TAB Ectopic Multiple Live Births  0 0 0 0 0    Past medical history,surgical history, problem list, medications, allergies, family history and social history were all reviewed and documented in the EPIC chart.   Directed ROS with pertinent positives and negatives documented in the history of present illness/assessment and plan.  Exam:  Vitals:   01/02/20 1028  BP: 112/70   General appearance:  Normal  Abdomen: Normal  Skyla IUD removal after verbal consent:  Gynecologic exam: Vulva normal.  Speculum:  Cervix/Vagina normal.  Normal secretions.  IUD strings visible at External Os.  IUD easily removed by pulling on strings.  Complete, intact.  Shown to patient and discarded.  Well tolerated.   Assessment/Plan:  29 y.o. G0  1. Encounter for IUD removal Easy removal of Skyla IUD.  IUD intact and complete.  No complication.  Well-tolerated by patient.  2. Encounter for other general counseling or advice on contraception Decision to use condoms with spermicides.  Patient is okay if conceives.  Other orders - sucralfate (CARAFATE) 1 g tablet; Take 1 g by mouth 4 (four) times daily -  with meals and at bedtime. - celecoxib (CELEBREX) 200 MG capsule; Take 200 mg by mouth 2 (two) times daily. - traMADol (ULTRAM) 50 MG tablet; Take by mouth every 6 (six) hours as needed.  [name] MD, 10:45 AM 01/02/2020

## 2020-01-04 ENCOUNTER — Ambulatory Visit: Payer: BC Managed Care – PPO | Admitting: Obstetrics & Gynecology

## 2020-01-04 DIAGNOSIS — J3089 Other allergic rhinitis: Secondary | ICD-10-CM | POA: Diagnosis not present

## 2020-01-04 DIAGNOSIS — J3081 Allergic rhinitis due to animal (cat) (dog) hair and dander: Secondary | ICD-10-CM | POA: Diagnosis not present

## 2020-01-04 DIAGNOSIS — J301 Allergic rhinitis due to pollen: Secondary | ICD-10-CM | POA: Diagnosis not present

## 2020-01-09 DIAGNOSIS — J301 Allergic rhinitis due to pollen: Secondary | ICD-10-CM | POA: Diagnosis not present

## 2020-01-09 DIAGNOSIS — J3081 Allergic rhinitis due to animal (cat) (dog) hair and dander: Secondary | ICD-10-CM | POA: Diagnosis not present

## 2020-01-09 DIAGNOSIS — J3089 Other allergic rhinitis: Secondary | ICD-10-CM | POA: Diagnosis not present

## 2020-01-11 DIAGNOSIS — J301 Allergic rhinitis due to pollen: Secondary | ICD-10-CM | POA: Diagnosis not present

## 2020-01-11 DIAGNOSIS — J3081 Allergic rhinitis due to animal (cat) (dog) hair and dander: Secondary | ICD-10-CM | POA: Diagnosis not present

## 2020-01-11 DIAGNOSIS — J3089 Other allergic rhinitis: Secondary | ICD-10-CM | POA: Diagnosis not present

## 2020-01-16 DIAGNOSIS — J301 Allergic rhinitis due to pollen: Secondary | ICD-10-CM | POA: Diagnosis not present

## 2020-01-16 DIAGNOSIS — J3081 Allergic rhinitis due to animal (cat) (dog) hair and dander: Secondary | ICD-10-CM | POA: Diagnosis not present

## 2020-01-16 DIAGNOSIS — J3089 Other allergic rhinitis: Secondary | ICD-10-CM | POA: Diagnosis not present

## 2020-01-18 DIAGNOSIS — J3089 Other allergic rhinitis: Secondary | ICD-10-CM | POA: Diagnosis not present

## 2020-01-18 DIAGNOSIS — J301 Allergic rhinitis due to pollen: Secondary | ICD-10-CM | POA: Diagnosis not present

## 2020-01-18 DIAGNOSIS — J3081 Allergic rhinitis due to animal (cat) (dog) hair and dander: Secondary | ICD-10-CM | POA: Diagnosis not present

## 2020-01-19 DIAGNOSIS — G5762 Lesion of plantar nerve, left lower limb: Secondary | ICD-10-CM | POA: Diagnosis not present

## 2020-01-23 DIAGNOSIS — J3081 Allergic rhinitis due to animal (cat) (dog) hair and dander: Secondary | ICD-10-CM | POA: Diagnosis not present

## 2020-01-23 DIAGNOSIS — J301 Allergic rhinitis due to pollen: Secondary | ICD-10-CM | POA: Diagnosis not present

## 2020-01-23 DIAGNOSIS — J3089 Other allergic rhinitis: Secondary | ICD-10-CM | POA: Diagnosis not present

## 2020-01-25 DIAGNOSIS — J3089 Other allergic rhinitis: Secondary | ICD-10-CM | POA: Diagnosis not present

## 2020-01-25 DIAGNOSIS — J3081 Allergic rhinitis due to animal (cat) (dog) hair and dander: Secondary | ICD-10-CM | POA: Diagnosis not present

## 2020-01-25 DIAGNOSIS — J301 Allergic rhinitis due to pollen: Secondary | ICD-10-CM | POA: Diagnosis not present

## 2020-01-30 DIAGNOSIS — J301 Allergic rhinitis due to pollen: Secondary | ICD-10-CM | POA: Diagnosis not present

## 2020-01-30 DIAGNOSIS — J3089 Other allergic rhinitis: Secondary | ICD-10-CM | POA: Diagnosis not present

## 2020-01-30 DIAGNOSIS — J3081 Allergic rhinitis due to animal (cat) (dog) hair and dander: Secondary | ICD-10-CM | POA: Diagnosis not present

## 2020-02-01 DIAGNOSIS — J3081 Allergic rhinitis due to animal (cat) (dog) hair and dander: Secondary | ICD-10-CM | POA: Diagnosis not present

## 2020-02-01 DIAGNOSIS — J3089 Other allergic rhinitis: Secondary | ICD-10-CM | POA: Diagnosis not present

## 2020-02-01 DIAGNOSIS — J301 Allergic rhinitis due to pollen: Secondary | ICD-10-CM | POA: Diagnosis not present

## 2020-02-06 DIAGNOSIS — J301 Allergic rhinitis due to pollen: Secondary | ICD-10-CM | POA: Diagnosis not present

## 2020-02-06 DIAGNOSIS — J3089 Other allergic rhinitis: Secondary | ICD-10-CM | POA: Diagnosis not present

## 2020-02-06 DIAGNOSIS — J3081 Allergic rhinitis due to animal (cat) (dog) hair and dander: Secondary | ICD-10-CM | POA: Diagnosis not present

## 2020-02-08 NOTE — Telephone Encounter (Signed)
Can you schedule OV. Thanks

## 2020-02-12 DIAGNOSIS — G8921 Chronic pain due to trauma: Secondary | ICD-10-CM | POA: Diagnosis not present

## 2020-02-12 DIAGNOSIS — G5762 Lesion of plantar nerve, left lower limb: Secondary | ICD-10-CM | POA: Diagnosis not present

## 2020-02-12 DIAGNOSIS — M545 Low back pain: Secondary | ICD-10-CM | POA: Diagnosis not present

## 2020-02-12 DIAGNOSIS — M79672 Pain in left foot: Secondary | ICD-10-CM | POA: Diagnosis not present

## 2020-02-13 ENCOUNTER — Other Ambulatory Visit: Payer: Self-pay

## 2020-02-13 ENCOUNTER — Encounter: Payer: Self-pay | Admitting: Nurse Practitioner

## 2020-02-13 ENCOUNTER — Ambulatory Visit (INDEPENDENT_AMBULATORY_CARE_PROVIDER_SITE_OTHER): Payer: BC Managed Care – PPO | Admitting: Nurse Practitioner

## 2020-02-13 VITALS — BP 122/78

## 2020-02-13 DIAGNOSIS — N644 Mastodynia: Secondary | ICD-10-CM

## 2020-02-13 DIAGNOSIS — J301 Allergic rhinitis due to pollen: Secondary | ICD-10-CM | POA: Diagnosis not present

## 2020-02-13 DIAGNOSIS — F526 Dyspareunia not due to a substance or known physiological condition: Secondary | ICD-10-CM

## 2020-02-13 DIAGNOSIS — J3081 Allergic rhinitis due to animal (cat) (dog) hair and dander: Secondary | ICD-10-CM | POA: Diagnosis not present

## 2020-02-13 DIAGNOSIS — J3089 Other allergic rhinitis: Secondary | ICD-10-CM | POA: Diagnosis not present
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
## 2020-02-15 DIAGNOSIS — J3089 Other allergic rhinitis: Secondary | ICD-10-CM | POA: Diagnosis not present

## 2020-02-15 DIAGNOSIS — J301 Allergic rhinitis due to pollen: Secondary | ICD-10-CM | POA: Diagnosis not present

## 2020-02-15 DIAGNOSIS — J3081 Allergic rhinitis due to animal (cat) (dog) hair and dander: Secondary | ICD-10-CM | POA: Diagnosis not present

## 2020-02-15 NOTE — Telephone Encounter (Signed)
Just FYI.

## 2020-02-25 DIAGNOSIS — M545 Low back pain: Secondary | ICD-10-CM | POA: Diagnosis not present

## 2020-02-25 DIAGNOSIS — Z3202 Encounter for pregnancy test, result negative: Secondary | ICD-10-CM | POA: Diagnosis not present

## 2020-02-25 DIAGNOSIS — N12 Tubulo-interstitial nephritis, not specified as acute or chronic: Secondary | ICD-10-CM | POA: Diagnosis not present

## 2020-02-25 DIAGNOSIS — R109 Unspecified abdominal pain: Secondary | ICD-10-CM | POA: Diagnosis not present

## 2020-02-25 DIAGNOSIS — R110 Nausea: Secondary | ICD-10-CM | POA: Diagnosis not present

## 2020-02-28 ENCOUNTER — Ambulatory Visit: Payer: BC Managed Care – PPO | Admitting: Family

## 2020-02-28 DIAGNOSIS — J301 Allergic rhinitis due to pollen: Secondary | ICD-10-CM | POA: Diagnosis not present

## 2020-02-28 DIAGNOSIS — J3089 Other allergic rhinitis: Secondary | ICD-10-CM | POA: Diagnosis not present

## 2020-02-28 DIAGNOSIS — J3081 Allergic rhinitis due to animal (cat) (dog) hair and dander: Secondary | ICD-10-CM | POA: Diagnosis not present

## 2020-03-11 DIAGNOSIS — J3081 Allergic rhinitis due to animal (cat) (dog) hair and dander: Secondary | ICD-10-CM | POA: Diagnosis not present

## 2020-03-11 DIAGNOSIS — J3089 Other allergic rhinitis: Secondary | ICD-10-CM | POA: Diagnosis not present

## 2020-03-11 DIAGNOSIS — J301 Allergic rhinitis due to pollen: Secondary | ICD-10-CM | POA: Diagnosis not present

## 2020-03-13 ENCOUNTER — Ambulatory Visit: Payer: BC Managed Care – PPO | Admitting: Family

## 2020-03-13 DIAGNOSIS — Z0289 Encounter for other administrative examinations: Secondary | ICD-10-CM

## 2020-03-21 ENCOUNTER — Telehealth: Payer: BC Managed Care – PPO | Admitting: Family

## 2020-03-21 DIAGNOSIS — R42 Dizziness and giddiness: Secondary | ICD-10-CM

## 2020-03-21 MED ORDER — MECLIZINE HCL 25 MG PO TABS: 25.0000 mg | ORAL_TABLET | Freq: Three times a day (TID) | ORAL | 0 refills | Status: DC | PRN

## 2020-03-21 NOTE — Progress Notes (Signed)
E Visit for Motion Sickness  We are sorry that you are not feeling well. Here is how we plan to help!  Based on what you have shared with me it looks like you have symptoms of motion sickness.  I have prescribed a medication that will help prevent or alleviate your symptoms:  Meclizine 25mg by mouth three times per day as needed for nausea/motion sickness   Prevention:  You might feel better if you keep your eyes focused on outside while you are in motion. For example, if you are in a car, sit in the front and look in the direction you are moving; if you are on a boat, stay on the deck and look to the horizon. This helps make what you see match the movement you are feeling, and so you are less likely to feel sick.  You should also avoid reading, watching a movie, texting or reading messages, or looking at things close to you inside the vehicle you are riding in.  . Use the seat head rest. Lean your head against the back of the seat or head rest when traveling in vehicles with seats to minimize head movements.  . On a ship: When making your reservations, choose a cabin in the middle of the ship and near the waterline. When on board, go up on deck and focus on the horizon.  . In an airplane: Request a window seat and look out the window. A seat over the front edge of the wing is the most preferable spot (the degree of motion is the lowest here). Direct the air vent to blow cool air on your face.  . On a train: Always face forward and sit near a window.  . In a vehicle: Sit in the front seat; if you are the passenger, look at the scenery in the distance. For some people, driving the vehicle (rather than being a passenger) is an instant remedy.  . Avoid others who have become nauseous with motion sickness. Seeing and smelling others who have motion sickness may cause you to become sick.  GET HELP RIGHT AWAY IF:   Your symptoms do not improve or worsen within 2 days after  treatment.   You cannot keep down fluids after trying the medication.   Other associated symptoms such as severe headache, visual field changes, fever, or intractable nausea and vomiting.  MAKE SURE YOU:   Understand these instructions.  Will watch your condition.  Will get help right away if you are not doing well or get worse.  Thank you for choosing an e-visit.  Your e-visit answers were reviewed by a board certified advanced clinical practitioner to complete your personal care plan. Depending upon the condition, your plan could have included both over the counter or prescription medications.  Please review your pharmacy choice. Be sure that the pharmacy you have chosen is open so that you can pick up your prescription now.  If there is a problem you may message your provider in MyChart to have the prescription routed to another pharmacy.  Your safety is important to us. If you have drug allergies check your prescription carefully.   For the next 24 hours, you can use MyChart to ask questions about today's visit, request a non-urgent call back, or ask for a work or school excuse from your e-visit provider.  You will get an e-mail in the next two days asking about your experience. I hope that your e-visit has been valuable and will speed   your recovery.   References or for more information: ThenWeb.com.ee https://my.Research[employer]s.be https://www.uptodate.com  Greater than 5 minutes, yet less than 10 minutes of time have been spent researching, coordinating, and implementing care for this patient.

## 2020-04-21 ENCOUNTER — Encounter: Payer: Self-pay | Admitting: Internal Medicine

## 2020-05-03 ENCOUNTER — Ambulatory Visit: Payer: Self-pay | Admitting: Internal Medicine

## 2020-05-17 ENCOUNTER — Other Ambulatory Visit: Payer: Self-pay

## 2020-05-17 ENCOUNTER — Encounter: Payer: Self-pay | Admitting: Internal Medicine

## 2020-05-17 ENCOUNTER — Ambulatory Visit (INDEPENDENT_AMBULATORY_CARE_PROVIDER_SITE_OTHER): Payer: BC Managed Care – PPO | Admitting: Internal Medicine

## 2020-05-17 VITALS — BP 102/70 | HR 103 | Temp 98.2°F | Wt 191.0 lb

## 2020-05-17 DIAGNOSIS — E282 Polycystic ovarian syndrome: Secondary | ICD-10-CM | POA: Diagnosis not present

## 2020-05-17 DIAGNOSIS — G43909 Migraine, unspecified, not intractable, without status migrainosus: Secondary | ICD-10-CM | POA: Insufficient documentation

## 2020-05-17 DIAGNOSIS — G43809 Other migraine, not intractable, without status migrainosus: Secondary | ICD-10-CM

## 2020-05-17 DIAGNOSIS — T753XXA Motion sickness, initial encounter: Secondary | ICD-10-CM | POA: Diagnosis not present

## 2020-05-17 MED ORDER — SCOPOLAMINE 1 MG/3DAYS TD PT72: 1.0000 | MEDICATED_PATCH | TRANSDERMAL | 12 refills | Status: DC

## 2020-05-17 NOTE — Assessment & Plan Note (Addendum)
Unclear if altitude and change in scenery may have triggered. Given new pattern to migraines should have CT head. Rx scopolamine patch for nausea and motion sickness.

## 2020-05-17 NOTE — Assessment & Plan Note (Signed)
Checking estrogen and testosterone levels.

## 2020-05-17 NOTE — Assessment & Plan Note (Signed)
Given change in pattern needs CT head to assess.

## 2020-05-17 NOTE — Patient Instructions (Signed)
The patch last 3 days and you place behind the ear. Switch ears with each patch. The main side effect is dry mouth which generally is mild. It can be bad enough that people remove the patch. Make sure to wash hands well after applying or removing patch as the medicine can be harmful if it accidentally gets into the eye.   We have ordered the labs and the CT scan head.

## 2020-05-17 NOTE — Progress Notes (Signed)
   Subjective:   Patient ID: Dawn Porter, female    DOB: December 16, 1990, 29 y.o.   MRN: [id number]  HPI The patient is a 29 YO female coming in for new concerns about motion sickness (she has moved to [facility] and when driving anywhere she gets motion sickness after about 10 minutes driving, more with curving roads but even on flat roads, she is at higher elevation there, getting worse and more frequent migraines in the same time period, has tried meclizine afterwards but afraid to take it with driving due to sedation, associated with severe nausea and vomiting, keeps some zofran in the car and uses if needed) and worsening migraines (two ER visits recently with intractable pain and n/v given migraine cocktail and discharged no imaging near her new home, having more migraines recently) and concerns about PCOS (not currently on birth control and does not want to resume, wants to check hormone levels).   Review of Systems  Constitutional: Negative.   HENT: Negative.   Eyes: Negative.   Respiratory: Negative for cough, chest tightness and shortness of breath.   Cardiovascular: Negative for chest pain, palpitations and leg swelling.  Gastrointestinal: Positive for nausea and vomiting. Negative for abdominal distention, abdominal pain, constipation and diarrhea.  Musculoskeletal: Positive for arthralgias, back pain, myalgias and neck pain.  Skin: Negative.   Neurological: Positive for dizziness, light-headedness and headaches.  Psychiatric/Behavioral: Negative.     Objective:  Physical Exam Constitutional:      Appearance: She is well-developed.  HENT:     Head: Normocephalic and atraumatic.  Cardiovascular:     Rate and Rhythm: Normal rate and regular rhythm.  Pulmonary:     Effort: Pulmonary effort is normal. No respiratory distress.     Breath sounds: Normal breath sounds. No wheezing or rales.  Abdominal:     General: Bowel sounds are normal. There is no distension.     Palpations:  Abdomen is soft.     Tenderness: There is no abdominal tenderness. There is no rebound.  Musculoskeletal:     Cervical back: Normal range of motion.  Skin:    General: Skin is warm and dry.  Neurological:     Mental Status: She is alert and oriented to person, place, and time.     Coordination: Coordination normal.     Vitals:   05/17/20 1602  BP: 102/70  Pulse: (!) 103  Temp: 98.2 F (36.8 C)  TempSrc: Oral  SpO2: 96%  Weight: 191 lb (86.6 kg)    This visit occurred during the SARS-CoV-2 public health emergency.  Safety protocols were in place, including screening questions prior to the visit, additional usage of staff PPE, and extensive cleaning of exam room while observing appropriate contact time as indicated for disinfecting solutions.   Assessment & Plan:

## 2020-05-27 LAB — ESTROGENS, TOTAL: Estrogen: 1473.2 pg/mL — ABNORMAL HIGH

## 2020-05-27 LAB — TESTOSTERONE, FREE: TESTOSTERONE FREE: 2.2 pg/mL (ref 0.2–5.0)

## 2020-05-27 LAB — TESTOSTERONE: Testosterone: 12 ng/dL

## 2020-05-28 ENCOUNTER — Encounter: Payer: Self-pay | Admitting: Internal Medicine

## 2020-06-05 ENCOUNTER — Ambulatory Visit
Admission: RE | Admit: 2020-06-05 | Discharge: 2020-06-05 | Disposition: A | Discharge status: Home / Self Care | Payer: BC Managed Care – PPO | Source: Ambulatory Visit | Attending: Internal Medicine | Admitting: Internal Medicine

## 2020-06-05 DIAGNOSIS — G43809 Other migraine, not intractable, without status migrainosus: Secondary | ICD-10-CM

## 2020-08-08 DIAGNOSIS — M5412 Radiculopathy, cervical region: Secondary | ICD-10-CM | POA: Insufficient documentation

## 2020-08-11 ENCOUNTER — Encounter: Payer: Self-pay | Admitting: Internal Medicine

## 2020-08-12 ENCOUNTER — Inpatient Hospital Stay: Payer: BC Managed Care – PPO | Admitting: Internal Medicine

## 2020-08-13 ENCOUNTER — Inpatient Hospital Stay: Payer: BC Managed Care – PPO | Admitting: Internal Medicine

## 2020-08-15 ENCOUNTER — Other Ambulatory Visit: Payer: Self-pay

## 2020-08-15 ENCOUNTER — Ambulatory Visit: Payer: BC Managed Care – PPO | Admitting: Internal Medicine

## 2020-08-15 ENCOUNTER — Encounter: Payer: Self-pay | Admitting: Internal Medicine

## 2020-08-15 VITALS — BP 122/80 | HR 88 | Temp 98.5°F | Resp 18 | Ht 64.0 in | Wt 187.6 lb

## 2020-08-15 DIAGNOSIS — N390 Urinary tract infection, site not specified: Secondary | ICD-10-CM | POA: Diagnosis not present

## 2020-08-15 LAB — URINALYSIS, ROUTINE W REFLEX MICROSCOPIC
Bilirubin Urine: NEGATIVE
Ketones, ur: 40 — AB
Leukocytes,Ua: NEGATIVE
Nitrite: NEGATIVE
Specific Gravity, Urine: 1.02 (ref 1.000–1.030)
Total Protein, Urine: NEGATIVE
Urine Glucose: NEGATIVE
Urobilinogen, UA: 0.2 (ref 0.0–1.0)
pH: 6 (ref 5.0–8.0)

## 2020-08-15 NOTE — Assessment & Plan Note (Signed)
Checking UA and culture today

## 2020-08-15 NOTE — Patient Instructions (Signed)
We will check the urine today and let you know about the results.

## 2020-08-15 NOTE — Progress Notes (Signed)
   Subjective:   Patient ID: Dawn Porter, female    DOB: 06-04-1991, 30 y.o.   MRN: [id number]  HPI The patient is a 30 YO female coming in for urgent care follow up of UTI, she has been treated for UTI 4 times in the last year. Most recently was treated with cipro for uti and within a week of finishing the symptoms returned. She then returned to urgent care and got course of keflex. She followed up with her urologist who felt that they could not tell she had UTI these times without culture proving it. She was checked that day which was last day of antibiotics and they said it was fine. They did not check culture. She is still symptom free but a little unclear about the different conversations about this.   Review of Systems  Constitutional: Negative.   HENT: Negative.   Eyes: Negative.   Respiratory: Negative for cough, chest tightness and shortness of breath.   Cardiovascular: Negative for chest pain, palpitations and leg swelling.  Gastrointestinal: Negative for abdominal distention, abdominal pain, constipation, diarrhea, nausea and vomiting.  Musculoskeletal: Positive for back pain and neck pain.  Skin: Negative.   Neurological: Negative.   Psychiatric/Behavioral: Negative.     Objective:  Physical Exam Constitutional:      Appearance: She is well-developed and well-nourished.  HENT:     Head: Normocephalic and atraumatic.  Eyes:     Extraocular Movements: EOM normal.  Cardiovascular:     Rate and Rhythm: Normal rate and regular rhythm.  Pulmonary:     Effort: Pulmonary effort is normal. No respiratory distress.     Breath sounds: Normal breath sounds. No wheezing or rales.  Abdominal:     General: Bowel sounds are normal. There is no distension.     Palpations: Abdomen is soft.     Tenderness: There is no abdominal tenderness. There is no rebound.  Musculoskeletal:        General: No edema.     Cervical back: Normal range of motion.  Skin:    General: Skin is warm  and dry.  Neurological:     Mental Status: She is alert and oriented to person, place, and time.     Coordination: Coordination normal.  Psychiatric:        Mood and Affect: Mood and affect normal.     Vitals:   08/15/20 1111  BP: 122/80  Pulse: 88  Resp: 18  Temp: 98.5 F (36.9 C)  TempSrc: Oral  SpO2: 95%  Weight: 187 lb 9.6 oz (85.1 kg)  Height: 5\' 4"  (1.626 m)    This visit occurred during the SARS-CoV-2 public health emergency.  Safety protocols were in place, including screening questions prior to the visit, additional usage of staff PPE, and extensive cleaning of exam room while observing appropriate contact time as indicated for disinfecting solutions.   Assessment & Plan:

## 2020-08-16 LAB — URINE CULTURE: Result:: NO GROWTH

## 2020-08-18 ENCOUNTER — Encounter: Payer: Self-pay | Admitting: Internal Medicine

## 2020-08-19 ENCOUNTER — Telehealth: Payer: BC Managed Care – PPO | Admitting: Internal Medicine

## 2020-08-19 NOTE — Progress Notes (Signed)
Error

## 2020-08-20 ENCOUNTER — Telehealth (INDEPENDENT_AMBULATORY_CARE_PROVIDER_SITE_OTHER): Payer: BC Managed Care – PPO | Admitting: Internal Medicine

## 2020-08-20 ENCOUNTER — Encounter: Payer: Self-pay | Admitting: Internal Medicine

## 2020-08-20 DIAGNOSIS — F5101 Primary insomnia: Secondary | ICD-10-CM | POA: Diagnosis not present

## 2020-08-20 MED ORDER — ESZOPICLONE 2 MG PO TABS: 2.0000 mg | ORAL_TABLET | Freq: Every evening | ORAL | 0 refills | Status: DC | PRN

## 2020-08-20 NOTE — Progress Notes (Signed)
Virtual Visit via Video Note  I connected with Dawn Porter on 08/20/20 at  3:20 PM EST by a video enabled telemedicine application and verified that I am speaking with the correct person using two identifiers.  The patient and the provider were at separate locations throughout the entire encounter. Patient location: car, Provider location: work   I discussed the limitations of evaluation and management by telemedicine and the availability of in person appointments. The patient expressed understanding and agreed to proceed. The patient and the provider were the only parties present for the visit unless noted in HPI below.  History of Present Illness: The patient is a 30 y.o. female with visit for insomnia. Treated by psych previously and they are currently out of network. She is working on moving her providers closer to where she lives near [place]. She has tried [place] and this did not really work to keep her asleep. She was able to fall asleep but only slept for 3-4 hours. She is not sure if this works for her or if the dose was off or she has read about lunesta and thinks that may be a better option. Lifelong poor sleeper  Observations/Objective: Appearance: normal, breathing appears normal, casual grooming, abdomen does not appear distended, throat normal, memory normal, mental status is A and O times 3  Assessment and Plan: See problem oriented charting  Follow Up Instructions: rx lunesta and she will follow up with [place] to let us know how this is doing  I discussed the assessment and treatment plan with the patient. The patient was provided an opportunity to ask questions and all were answered. The patient agreed with the plan and demonstrated an understanding of the instructions.   The patient was advised to call back or seek an in-person evaluation if the symptoms worsen or if the condition fails to improve as anticipated.  Hoyt Koch, MD

## 2020-08-22 DIAGNOSIS — G4700 Insomnia, unspecified: Secondary | ICD-10-CM | POA: Insufficient documentation

## 2020-08-22 MED ORDER — BELSOMRA 10 MG PO TABS: 10.0000 mg | ORAL_TABLET | Freq: Every day | ORAL | 0 refills | Status: DC

## 2020-08-22 MED ORDER — TRAZODONE HCL 50 MG PO TABS: 50.0000 mg | ORAL_TABLET | Freq: Every evening | ORAL | 3 refills | Status: DC | PRN

## 2020-08-22 NOTE — Assessment & Plan Note (Signed)
Rx lunesta 2 mg to try for sleep. She will let us know if effective. We did talk about the fact that most sleeping medications can be habit forming.

## 2020-08-22 NOTE — Addendum Note (Signed)
Addended by: Pricilla Holm A on: 08/22/2020 03:23 PM   Modules accepted: Orders

## 2020-08-27 NOTE — Telephone Encounter (Signed)
Follow up message   Patient calling to request status of prior auth  For Suvorexant (BELSOMRA) 10 MG TABS

## 2020-09-04 ENCOUNTER — Telehealth: Payer: Self-pay

## 2020-09-04 NOTE — Telephone Encounter (Signed)
Prior authorization has been initiated on covermymeds. KeySharlene Dory  PA Case ID: [id number] ICD 10 code: G47.000   I will update once a decision has been made.

## 2020-09-04 NOTE — Telephone Encounter (Signed)
Patient calling for status of prior auth  Please call

## 2020-09-04 NOTE — Telephone Encounter (Signed)
Spoke with the patient to let her know that her insurance has not made a decision on our end.

## 2020-09-05 NOTE — Telephone Encounter (Signed)
Patient called and is requesting a call back in regards to PA. She can be reached at [phone]

## 2020-09-10 MED ORDER — ZALEPLON 5 MG PO CAPS: 5.0000 mg | ORAL_CAPSULE | Freq: Every evening | ORAL | 0 refills | Status: DC | PRN

## 2020-09-10 NOTE — Addendum Note (Signed)
Addended by: Pricilla Holm A on: 09/10/2020 08:03 AM   Modules accepted: Orders

## 2020-10-18 ENCOUNTER — Other Ambulatory Visit: Payer: Self-pay | Admitting: Internal Medicine

## 2020-10-18 ENCOUNTER — Ambulatory Visit: Payer: BC Managed Care – PPO | Admitting: Internal Medicine

## 2020-10-18 ENCOUNTER — Encounter: Payer: Self-pay | Admitting: Internal Medicine

## 2020-10-18 ENCOUNTER — Other Ambulatory Visit: Payer: Self-pay

## 2020-10-18 DIAGNOSIS — R1031 Right lower quadrant pain: Secondary | ICD-10-CM | POA: Insufficient documentation

## 2020-10-18 MED ORDER — CEPHALEXIN 500 MG PO CAPS: 500.0000 mg | ORAL_CAPSULE | Freq: Two times a day (BID) | ORAL | 0 refills | Status: AC

## 2020-10-18 NOTE — Patient Instructions (Addendum)
We have drained the area and sent in keflex to take 1 pill twice a day for 3 days.  Incision and Drainage, Care After This sheet gives you information about how to care for yourself after your procedure. Your health care provider may also give you more specific instructions. If you have problems or questions, contact your health care provider. What can I expect after the procedure? After the procedure, it is common to have:  Pain or discomfort around the incision site.  Blood, fluid, or pus (drainage) from the incision.  Redness and firm skin around the incision site. Follow these instructions at home: Medicines  Take over-the-counter and prescription medicines only as told by your health care provider.  If you were prescribed an antibiotic medicine, use or take it as told by your health care provider. Do not stop using the antibiotic even if you start to feel better. Wound care Follow instructions from your health care provider about how to take care of your wound. Make sure you:  Wash your hands with soap and water before and after you change your bandage (dressing). If soap and water are not available, use hand sanitizer.  Change your dressing and packing as told by your health care provider. ? If your dressing is dry or stuck when you try to remove it, moisten or wet the dressing with saline or water so that it can be removed without harming your skin or tissues. ? If your wound is packed, leave it in place until your health care provider tells you to remove it. To remove the packing, moisten or wet the packing with saline or water so that it can be removed without harming your skin or tissues.  Leave stitches (sutures), skin glue, or adhesive strips in place. These skin closures may need to stay in place for 2 weeks or longer. If adhesive strip edges start to loosen and curl up, you may trim the loose edges. Do not remove adhesive strips completely unless your health care provider  tells you to do that. Check your wound every day for signs of infection. Check for:  More redness, swelling, or pain.  More fluid or blood.  Warmth.  Pus or a bad smell. If you were sent home with a drain tube in place, follow instructions from your health care provider about:  How to empty it.  How to care for it at home.   General instructions  Rest the affected area.  Do not take baths, swim, or use a hot tub until your health care provider approves. Ask your health care provider if you may take showers. You may only be allowed to take sponge baths.  Return to your normal activities as told by your health care provider. Ask your health care provider what activities are safe for you. Your health care provider may put you on activity or lifting restrictions.  The incision will continue to drain. It is normal to have some clear or slightly bloody drainage. The amount of drainage should lessen each day.  Do not apply any creams, ointments, or liquids unless you have been told to by your health care provider.  Keep all follow-up visits as told by your health care provider. This is important. Contact a health care provider if:  Your cyst or abscess returns.  You have a fever or chills.  You have more redness, swelling, or pain around your incision.  You have more fluid or blood coming from your incision.  Your incision feels  warm to the touch.  You have pus or a bad smell coming from your incision.  You have red streaks above or below the incision site. Get help right away if:  You have severe pain or bleeding.  You cannot eat or drink without vomiting.  You have decreased urine output.  You become short of breath.  You have chest pain.  You cough up blood.  The affected area becomes numb or starts to tingle. These symptoms may represent a serious problem that is an emergency. Do not wait to see if the symptoms will go away. Get medical help right away. Call  your local emergency services (911 in the [place] Do not drive yourself to the hospital. Summary  After this procedure, it is common to have fluid, blood, or pus coming from the surgery site.  Follow all home care instructions. You will be told how to take care of your incision, how to check for infection, and how to take medicines.  If you were prescribed an antibiotic medicine, take it as told by your health care provider. Do not stop taking the antibiotic even if you start to feel better.  Contact a health care provider if you have increased redness, swelling, or pain around your incision. Get help right away if you have chest pain, you vomit, you cough up blood, or you have shortness of breath.  Keep all follow-up visits as told by your health care provider. This is important. This information is not intended to replace advice given to you by your health care provider. Make sure you discuss any questions you have with your health care provider. Document Revised: 05/02/2018 Document Reviewed: 05/02/2018 Elsevier Patient Education  2021 [employer].

## 2020-10-18 NOTE — Progress Notes (Signed)
Incision and Drainage Procedure Note  Pre-operative Diagnosis: abscess right inguinal  Post-operative Diagnosis: same  Indications: pain, swelling  Anesthesia: 1% plain lidocaine  Procedure Details  The procedure, risks and complications have been discussed in detail (including, but not limited to airway compromise, infection, bleeding) with the patient, and the patient has signed consent to the procedure.  The skin was sterilely prepped and draped over the affected area in the usual fashion. After adequate local anesthesia, I&D with a #11 and 15 blade was performed on the right, inguinal region. Purulent drainage: present The patient was observed until stable.  Findings: Purulent drainage  EBL: 2 cc's  Drains: none  Condition: Tolerated procedure well and Stable   Complications: none.

## 2020-10-18 NOTE — Assessment & Plan Note (Signed)
Abscess which was drained with I and D during visit. She will require antibiotics as this was deep so rx keflex 3 day course.

## 2020-10-18 NOTE — Progress Notes (Signed)
   Subjective:   Patient ID: Dawn Porter, female    DOB: 30-Jul-1990, 30 y.o.   MRN: [id number]  HPI The patient is a 30 YO female coming in for concerns about right inguinal pain. Started about 1 week or so ago. Overall is worsening. Swelling some and redness on the skin. Denies fevers or chills. Denies injury or drainage from site. Pain 8/10 with moving or sitting due to pressure.   Review of Systems  Constitutional: Negative.   HENT: Negative.   Eyes: Negative.   Respiratory: Negative for cough, chest tightness and shortness of breath.   Cardiovascular: Negative for chest pain, palpitations and leg swelling.  Gastrointestinal: Negative for abdominal distention, abdominal pain, constipation, diarrhea, nausea and vomiting.  Musculoskeletal: Positive for neck pain and neck stiffness.  Skin: Positive for color change and rash.  Neurological: Negative.   Psychiatric/Behavioral: Negative.     Objective:  Physical Exam Constitutional:      Appearance: She is well-developed.  HENT:     Head: Normocephalic and atraumatic.  Cardiovascular:     Rate and Rhythm: Normal rate and regular rhythm.  Pulmonary:     Effort: Pulmonary effort is normal. No respiratory distress.     Breath sounds: Normal breath sounds. No wheezing or rales.  Abdominal:     General: Bowel sounds are normal. There is no distension.     Palpations: Abdomen is soft.     Tenderness: There is no abdominal tenderness. There is no rebound.  Musculoskeletal:     Cervical back: Normal range of motion.  Skin:    General: Skin is warm and dry.     Comments: Abscess soft with fluctuance right inguinal region  Neurological:     Mental Status: She is alert and oriented to person, place, and time.     Coordination: Coordination normal.     Vitals:   10/18/20 1530  BP: 130/64  Pulse: 82  Resp: 18  Temp: 98.4 F (36.9 C)  TempSrc: Oral  SpO2: 97%  Weight: 190 lb 6.4 oz (86.4 kg)  Height: 5\' 4"  (1.626 m)     This visit occurred during the SARS-CoV-2 public health emergency.  Safety protocols were in place, including screening questions prior to the visit, additional usage of staff PPE, and extensive cleaning of exam room while observing appropriate contact time as indicated for disinfecting solutions.   Assessment & Plan:

## 2020-10-27 DIAGNOSIS — G8929 Other chronic pain: Secondary | ICD-10-CM | POA: Insufficient documentation

## 2020-10-27 DIAGNOSIS — E669 Obesity, unspecified: Secondary | ICD-10-CM | POA: Insufficient documentation

## 2020-11-01 ENCOUNTER — Encounter: Payer: Self-pay | Admitting: Internal Medicine

## 2020-11-05 MED ORDER — BUSPIRONE HCL 5 MG PO TABS: 5.0000 mg | ORAL_TABLET | Freq: Two times a day (BID) | ORAL | 0 refills | Status: AC | PRN

## 2020-11-08 ENCOUNTER — Telehealth: Payer: Self-pay | Admitting: Internal Medicine

## 2020-11-08 NOTE — Telephone Encounter (Signed)
Neither of these really treat anxiety or depression. Buspar is an as needed anxiety medication and sonata is a sleep medication. Maybe it would be better for her to see a psychiatrist locally or follow up with [place] on other suggestions for treatment.

## 2020-11-08 NOTE — Telephone Encounter (Signed)
Unable to get in contact with the patient. LVM asking her to give our office a call back office number was provided. Mychart message was sent as well with crawford's recommendations.

## 2020-11-08 NOTE — Telephone Encounter (Signed)
Patient has recently started care under [facility]. Patient has a history of severe anxiety and depression. They prescribed busPIRone (BUSPAR) 5 MG tablet and zaleplon (SONATA) 5 MG capsule for plan of treatment. However, they are recommending a higher dosage for patient due to debilitating anxiety and depression. [facility] is unable to prescribe high dosage medications. Requesting PCP to prescribe higher dosage for treatment.   Please advise. Best contact #: [phone]

## 2020-11-08 NOTE — Telephone Encounter (Signed)
See below

## 2020-11-14 ENCOUNTER — Ambulatory Visit: Payer: Self-pay | Admitting: Podiatry

## 2020-11-19 ENCOUNTER — Ambulatory Visit: Payer: BC Managed Care – PPO | Admitting: Internal Medicine

## 2020-11-21 ENCOUNTER — Encounter: Payer: Self-pay | Admitting: Internal Medicine

## 2020-11-21 ENCOUNTER — Other Ambulatory Visit: Payer: Self-pay

## 2020-11-21 ENCOUNTER — Ambulatory Visit: Payer: BC Managed Care – PPO | Admitting: Internal Medicine

## 2020-11-21 VITALS — BP 122/62 | HR 95 | Temp 97.8°F | Resp 18 | Ht 64.0 in | Wt 195.0 lb

## 2020-11-21 DIAGNOSIS — E242 Drug-induced Cushing's syndrome: Secondary | ICD-10-CM | POA: Diagnosis not present

## 2020-11-21 LAB — CBC
HCT: 44 % (ref 36.0–46.0)
Hemoglobin: 14.8 g/dL (ref 12.0–15.0)
MCHC: 33.6 g/dL (ref 30.0–36.0)
MCV: 86.9 fl (ref 78.0–100.0)
Platelets: 353 10*3/uL (ref 150.0–400.0)
RBC: 5.07 Mil/uL (ref 3.87–5.11)
RDW: 15.9 % — ABNORMAL HIGH (ref 11.5–15.5)
WBC: 12.7 10*3/uL — ABNORMAL HIGH (ref 4.0–10.5)

## 2020-11-21 LAB — COMPREHENSIVE METABOLIC PANEL
ALT: 26 U/L (ref 0–35)
AST: 13 U/L (ref 0–37)
Albumin: 4.4 g/dL (ref 3.5–5.2)
Alkaline Phosphatase: 76 U/L (ref 39–117)
BUN: 15 mg/dL (ref 6–23)
CO2: 27 mEq/L (ref 19–32)
Calcium: 10.4 mg/dL (ref 8.4–10.5)
Chloride: 103 mEq/L (ref 96–112)
Creatinine, Ser: 0.88 mg/dL (ref 0.40–1.20)
GFR: 87.11 mL/min (ref >60.00)
Glucose, Bld: 76 mg/dL (ref 70–99)
Potassium: 4.2 mEq/L (ref 3.5–5.1)
Sodium: 140 mEq/L (ref 135–145)
Total Bilirubin: 0.4 mg/dL (ref 0.2–1.2)
Total Protein: 8 g/dL (ref 6.0–8.3)

## 2020-11-21 LAB — CORTISOL: Cortisol, Plasma: 0 ug/dL

## 2020-11-21 LAB — HEMOGLOBIN A1C: Hgb A1c MFr Bld: 6 % (ref 4.6–6.5)

## 2020-11-21 LAB — TSH: TSH: 1.69 u[IU]/mL (ref 0.35–4.50)

## 2020-11-21 NOTE — Patient Instructions (Signed)
We will check the labs today and may need to do a 24 hour urine test.

## 2020-11-21 NOTE — Progress Notes (Signed)
   Subjective:   Patient ID: Dawn Porter, female    DOB: September 07, 1990, 30 y.o.   MRN: [id number]  HPI The patient is a 30 YO female coming in for concerns about cushing syndrome. She does have prominence around the neck and some stretch marks which are purple/red. She also has a lot of acne and hair growth. Has a lot of orthopedic issues and has had many steroid injections in the feet, neck, spine, hips in the last 3-6 months. Denies taking any oral steroids in the last 3 months. Has changed diet and is still gaining weight which seems not right compared to diet changes.   Review of Systems  Constitutional:  Positive for unexpected weight change.  HENT: Negative.    Eyes: Negative.   Respiratory:  Negative for cough, chest tightness and shortness of breath.   Cardiovascular:  Negative for chest pain, palpitations and leg swelling.  Gastrointestinal:  Negative for abdominal distention, abdominal pain, constipation, diarrhea, nausea and vomiting.  Musculoskeletal: Negative.   Skin: Negative.   Neurological: Negative.   Psychiatric/Behavioral: Negative.     Objective:  Physical Exam Constitutional:      Appearance: She is well-developed.  HENT:     Head: Normocephalic and atraumatic.     Comments: Prominence around the neck, acne on the face Cardiovascular:     Rate and Rhythm: Normal rate and regular rhythm.  Pulmonary:     Effort: Pulmonary effort is normal. No respiratory distress.     Breath sounds: Normal breath sounds. No wheezing or rales.  Abdominal:     General: Bowel sounds are normal. There is no distension.     Palpations: Abdomen is soft.     Tenderness: There is no abdominal tenderness. There is no rebound.  Musculoskeletal:        General: Tenderness present.     Cervical back: Normal range of motion.  Skin:    General: Skin is warm and dry.  Neurological:     Mental Status: She is alert and oriented to person, place, and time.     Coordination: Coordination  normal.    Vitals:   11/21/20 1010  BP: 122/62  Pulse: 95  Resp: 18  Temp: 97.8 F (36.6 C)  TempSrc: Oral  SpO2: 94%  Weight: 195 lb (88.5 kg)  Height: 5\' 4"  (1.626 m)    This visit occurred during the SARS-CoV-2 public health emergency.  Safety protocols were in place, including screening questions prior to the visit, additional usage of staff PPE, and extensive cleaning of exam room while observing appropriate contact time as indicated for disinfecting solutions.   Assessment & Plan:

## 2020-11-21 NOTE — Assessment & Plan Note (Signed)
I suspect this is the diagnosis with multiple recent steroid exposures through joint and foot and neck and back injections. Will check cortisol level and CBC, CMP, TSH. She has possible diagnosis PCOS which might account for some acne and hair growth but has been checked for and does not have high testosterone level. If cortisol is normal or low would just observe and see if her symptoms resolve with avoiding steroids. If cortisol high normal or high will proceed with 24 hour urine test. Explained this to her during visit.

## 2020-11-22 ENCOUNTER — Other Ambulatory Visit: Payer: Self-pay

## 2020-11-22 ENCOUNTER — Encounter: Payer: Self-pay | Admitting: Internal Medicine

## 2020-11-23 ENCOUNTER — Other Ambulatory Visit: Payer: Self-pay | Admitting: Internal Medicine

## 2020-11-25 NOTE — Telephone Encounter (Signed)
Patient said that she had this medication on automatic refill but does not need the medication.

## 2020-11-25 NOTE — Telephone Encounter (Signed)
LVM to asking pt to call back to confirm if medication was working for her as this was a trial prescription per pt advice request encounter on 5/20.

## 2020-11-26 ENCOUNTER — Encounter: Payer: Self-pay | Admitting: Podiatry

## 2020-11-26 ENCOUNTER — Other Ambulatory Visit: Payer: Self-pay

## 2020-11-26 ENCOUNTER — Ambulatory Visit: Payer: BC Managed Care – PPO | Admitting: Podiatry

## 2020-11-26 ENCOUNTER — Ambulatory Visit (INDEPENDENT_AMBULATORY_CARE_PROVIDER_SITE_OTHER): Payer: BC Managed Care – PPO

## 2020-11-26 ENCOUNTER — Encounter: Payer: Self-pay | Admitting: Internal Medicine

## 2020-11-26 DIAGNOSIS — M778 Other enthesopathies, not elsewhere classified: Secondary | ICD-10-CM

## 2020-11-26 DIAGNOSIS — M79671 Pain in right foot: Secondary | ICD-10-CM | POA: Diagnosis not present

## 2020-11-26 DIAGNOSIS — M79672 Pain in left foot: Secondary | ICD-10-CM | POA: Diagnosis not present

## 2020-11-26 DIAGNOSIS — M2060 Acquired deformities of toe(s), unspecified, unspecified foot: Secondary | ICD-10-CM

## 2020-11-26 DIAGNOSIS — M2550 Pain in unspecified joint: Secondary | ICD-10-CM

## 2020-11-26 DIAGNOSIS — M7662 Achilles tendinitis, left leg: Secondary | ICD-10-CM

## 2020-11-26 NOTE — Patient Instructions (Signed)

## 2020-11-27 ENCOUNTER — Telehealth: Payer: Self-pay | Admitting: Internal Medicine

## 2020-11-27 DIAGNOSIS — E242 Drug-induced Cushing's syndrome: Secondary | ICD-10-CM

## 2020-11-27 NOTE — Telephone Encounter (Signed)
Pt stated that she was not getting steroid injections. She spoke with her doctors and was informed that it was anesthetic injections.  She would like further information on why her cortisol would be depleted? She is requesting to be retested.  Pt stated she spoke with you about a referral to endocrinologist. Please advise.

## 2020-11-27 NOTE — Telephone Encounter (Signed)
Patient is requesting a call back in regards to her recent lab results. She said that she was able to see Dr. Charlynne Cousins message and wanted to let her know that she spoke with her doctors and was not getting steroid injections. She can be reached at [phone]

## 2020-11-28 LAB — ANA: Anti Nuclear Antibody (ANA): NEGATIVE

## 2020-11-28 LAB — SEDIMENTATION RATE: Sed Rate: 39 mm/h — ABNORMAL HIGH (ref 0–20)

## 2020-11-28 LAB — C-REACTIVE PROTEIN: CRP: 10.6 mg/L — ABNORMAL HIGH (ref <8.0)

## 2020-11-28 LAB — RHEUMATOID FACTOR: Rheumatoid fact SerPl-aCnc: <14 IU/mL (ref <14)

## 2020-11-28 LAB — HLA-B27 ANTIGEN: HLA-B27 Antigen: NEGATIVE

## 2020-11-28 NOTE — Telephone Encounter (Signed)
Pt called back and she was advised to check cortisol levels. Made a lab appointment for 12/02/20. She verbalized understanding.

## 2020-11-28 NOTE — Telephone Encounter (Signed)
We were concerned more with high cortisol level with her symptoms so this was ruled out with the test. She does not have symptoms of low cortisol so likely this has to do with the test's accuracy. We could check this again (would be before 9am drawn) and have replaced the orders. I think she would benefit more from talking to an ob/gyn about possible PCOS as cause for her symptoms than an endocrinologist initially.

## 2020-11-28 NOTE — Telephone Encounter (Signed)
Called to advise pt, no answer, left a VM to call back

## 2020-11-30 NOTE — Progress Notes (Signed)
Subjective: 30 year old female presents the office today for concerns of discomfort to her big toes bilaterally left side worse than right.  Is been ongoing about 1 month.  She also gets discomfort in the left heel at nighttime.  She denies any recent injury or trauma.  She is scheduled to have surgery on her left foot with [facility] to fix her hammertoe/neuroma.  Patient had 2 other foot surgeries on the left foot.  She is currently being worked up for [employer].  Denies any systemic complaints such as fevers, chills, nausea, vomiting. No acute changes since last appointment, and no other complaints at this time.   Objective: AAO x3, NAD DP/PT pulses palpable bilaterally, CRT less than 3 seconds There is mild discomfort along the first MPJs bilaterally and there is localized edema there is no erythema or warmth.  There is no crepitation with MPJ range of motion.  Also discomfort to the left heel on posterior aspect along the early Achilles tendon.  Achilles tendon appears intact there is no edema to this area.  No pain about across the calcaneus.  Equinus is present.  There is discomfort identified at this time.  Hammertoe is present as well as neuroma in the left foot. No pain with calf compression, swelling, warmth, erythema  Assessment: 30 year old female with Achilles tendinitis left side, equinus; capsulitis  Plan: -All treatment options discussed with the patient including all alternatives, risks, complications.  -X-rays obtained and reviewed.  No evidence of acute fracture.  Hammertoes are present.  Haglund's. -Regards to the toe pain I do think this is more capsulitis.  Offered steroid injection but we decided to hold off on this today as she is currently being worked up for [employer].  Patient consider this.  Discussed shoe modifications. -Regards to the Achilles tendinitis discussed traction, icing daily.  Dispensed a night splint.  He will work with needed. -Given chronic foot pain  consider autoimmune issues.  We will do blood work including rheumatoid factor, ANA, HLA-B27, ESR, CRP. -Patient encouraged to call the office with any questions, concerns, change in symptoms.   Trula Slade DPM

## 2020-12-02 ENCOUNTER — Encounter: Payer: Self-pay | Admitting: Internal Medicine

## 2020-12-02 ENCOUNTER — Other Ambulatory Visit (INDEPENDENT_AMBULATORY_CARE_PROVIDER_SITE_OTHER): Payer: BC Managed Care – PPO

## 2020-12-02 ENCOUNTER — Other Ambulatory Visit: Payer: Self-pay | Admitting: Internal Medicine

## 2020-12-02 ENCOUNTER — Other Ambulatory Visit: Payer: Self-pay

## 2020-12-02 DIAGNOSIS — E242 Drug-induced Cushing's syndrome: Secondary | ICD-10-CM | POA: Diagnosis not present

## 2020-12-02 LAB — CORTISOL: Cortisol, Plasma: 0 ug/dL

## 2020-12-04 ENCOUNTER — Encounter: Payer: BC Managed Care – PPO | Admitting: Nurse Practitioner

## 2021-01-20 ENCOUNTER — Ambulatory Visit: Payer: BC Managed Care – PPO | Admitting: Podiatry

## 2021-01-21 ENCOUNTER — Ambulatory Visit: Payer: BC Managed Care – PPO | Admitting: Podiatry

## 2021-01-28 ENCOUNTER — Ambulatory Visit: Payer: BC Managed Care – PPO | Admitting: Podiatry
